# Patient Record
Sex: Female | Born: 1945 | Race: Black or African American | Hispanic: No | Smoking: Never smoker
Health system: Southern US, Community
[De-identification: ages and names within clinical notes are randomized; demographics above are authoritative.]

## PROBLEM LIST (undated history)

## (undated) DIAGNOSIS — E119 Type 2 diabetes mellitus without complications: Secondary | ICD-10-CM

## (undated) DIAGNOSIS — I10 Essential (primary) hypertension: Secondary | ICD-10-CM

## (undated) DIAGNOSIS — J189 Pneumonia, unspecified organism: Secondary | ICD-10-CM

---

## 2001-03-19 ENCOUNTER — Emergency Department (HOSPITAL_COMMUNITY): Admission: EM | Admit: 2001-03-19 | Discharge: 2001-03-19 | Payer: Self-pay | Admitting: Emergency Medicine

## 2014-11-03 DIAGNOSIS — R05 Cough: Secondary | ICD-10-CM | POA: Diagnosis not present

## 2014-11-03 DIAGNOSIS — I129 Hypertensive chronic kidney disease with stage 1 through stage 4 chronic kidney disease, or unspecified chronic kidney disease: Secondary | ICD-10-CM | POA: Diagnosis not present

## 2014-11-03 DIAGNOSIS — R079 Chest pain, unspecified: Secondary | ICD-10-CM | POA: Diagnosis not present

## 2014-11-03 DIAGNOSIS — I10 Essential (primary) hypertension: Secondary | ICD-10-CM | POA: Diagnosis not present

## 2014-11-03 DIAGNOSIS — D491 Neoplasm of unspecified behavior of respiratory system: Secondary | ICD-10-CM | POA: Diagnosis not present

## 2014-11-03 DIAGNOSIS — I76 Septic arterial embolism: Secondary | ICD-10-CM | POA: Diagnosis not present

## 2014-11-03 DIAGNOSIS — R918 Other nonspecific abnormal finding of lung field: Secondary | ICD-10-CM | POA: Diagnosis not present

## 2014-11-03 DIAGNOSIS — N390 Urinary tract infection, site not specified: Secondary | ICD-10-CM | POA: Diagnosis not present

## 2014-11-03 DIAGNOSIS — N179 Acute kidney failure, unspecified: Secondary | ICD-10-CM | POA: Diagnosis not present

## 2014-11-03 DIAGNOSIS — N183 Chronic kidney disease, stage 3 (moderate): Secondary | ICD-10-CM | POA: Diagnosis not present

## 2014-11-03 DIAGNOSIS — J189 Pneumonia, unspecified organism: Secondary | ICD-10-CM | POA: Diagnosis not present

## 2014-11-04 DIAGNOSIS — T39315A Adverse effect of propionic acid derivatives, initial encounter: Secondary | ICD-10-CM | POA: Diagnosis not present

## 2014-11-04 DIAGNOSIS — I776 Arteritis, unspecified: Secondary | ICD-10-CM | POA: Diagnosis not present

## 2014-11-04 DIAGNOSIS — Z7982 Long term (current) use of aspirin: Secondary | ICD-10-CM | POA: Diagnosis not present

## 2014-11-04 DIAGNOSIS — E785 Hyperlipidemia, unspecified: Secondary | ICD-10-CM | POA: Diagnosis present

## 2014-11-04 DIAGNOSIS — N209 Urinary calculus, unspecified: Secondary | ICD-10-CM | POA: Diagnosis not present

## 2014-11-04 DIAGNOSIS — Z833 Family history of diabetes mellitus: Secondary | ICD-10-CM | POA: Diagnosis not present

## 2014-11-04 DIAGNOSIS — E1165 Type 2 diabetes mellitus with hyperglycemia: Secondary | ICD-10-CM | POA: Diagnosis not present

## 2014-11-04 DIAGNOSIS — E872 Acidosis: Secondary | ICD-10-CM | POA: Diagnosis not present

## 2014-11-04 DIAGNOSIS — N179 Acute kidney failure, unspecified: Secondary | ICD-10-CM | POA: Diagnosis not present

## 2014-11-04 DIAGNOSIS — R0602 Shortness of breath: Secondary | ICD-10-CM | POA: Diagnosis not present

## 2014-11-04 DIAGNOSIS — E669 Obesity, unspecified: Secondary | ICD-10-CM | POA: Diagnosis not present

## 2014-11-04 DIAGNOSIS — I10 Essential (primary) hypertension: Secondary | ICD-10-CM | POA: Diagnosis not present

## 2014-11-04 DIAGNOSIS — R112 Nausea with vomiting, unspecified: Secondary | ICD-10-CM | POA: Diagnosis present

## 2014-11-04 DIAGNOSIS — E119 Type 2 diabetes mellitus without complications: Secondary | ICD-10-CM | POA: Diagnosis present

## 2014-11-04 DIAGNOSIS — N183 Chronic kidney disease, stage 3 (moderate): Secondary | ICD-10-CM | POA: Diagnosis present

## 2014-11-04 DIAGNOSIS — I76 Septic arterial embolism: Secondary | ICD-10-CM | POA: Diagnosis not present

## 2014-11-04 DIAGNOSIS — I129 Hypertensive chronic kidney disease with stage 1 through stage 4 chronic kidney disease, or unspecified chronic kidney disease: Secondary | ICD-10-CM | POA: Diagnosis present

## 2014-11-04 DIAGNOSIS — Z794 Long term (current) use of insulin: Secondary | ICD-10-CM | POA: Diagnosis not present

## 2014-11-04 DIAGNOSIS — Z79899 Other long term (current) drug therapy: Secondary | ICD-10-CM | POA: Diagnosis not present

## 2014-11-04 DIAGNOSIS — Z6841 Body Mass Index (BMI) 40.0 and over, adult: Secondary | ICD-10-CM | POA: Diagnosis not present

## 2014-11-04 DIAGNOSIS — J189 Pneumonia, unspecified organism: Secondary | ICD-10-CM | POA: Diagnosis present

## 2014-11-04 DIAGNOSIS — T502X5A Adverse effect of carbonic-anhydrase inhibitors, benzothiadiazides and other diuretics, initial encounter: Secondary | ICD-10-CM | POA: Diagnosis not present

## 2014-11-04 DIAGNOSIS — N200 Calculus of kidney: Secondary | ICD-10-CM | POA: Diagnosis not present

## 2014-11-04 DIAGNOSIS — N170 Acute kidney failure with tubular necrosis: Secondary | ICD-10-CM | POA: Diagnosis not present

## 2014-11-04 DIAGNOSIS — E039 Hypothyroidism, unspecified: Secondary | ICD-10-CM | POA: Diagnosis present

## 2014-11-04 DIAGNOSIS — N390 Urinary tract infection, site not specified: Secondary | ICD-10-CM | POA: Diagnosis not present

## 2014-11-04 DIAGNOSIS — R918 Other nonspecific abnormal finding of lung field: Secondary | ICD-10-CM | POA: Diagnosis present

## 2014-11-04 DIAGNOSIS — J90 Pleural effusion, not elsewhere classified: Secondary | ICD-10-CM | POA: Diagnosis not present

## 2014-11-04 DIAGNOSIS — D491 Neoplasm of unspecified behavior of respiratory system: Secondary | ICD-10-CM | POA: Diagnosis not present

## 2014-11-04 DIAGNOSIS — R079 Chest pain, unspecified: Secondary | ICD-10-CM | POA: Diagnosis not present

## 2014-11-04 DIAGNOSIS — E869 Volume depletion, unspecified: Secondary | ICD-10-CM | POA: Diagnosis not present

## 2014-11-04 DIAGNOSIS — E1122 Type 2 diabetes mellitus with diabetic chronic kidney disease: Secondary | ICD-10-CM | POA: Diagnosis not present

## 2014-11-04 DIAGNOSIS — Z7722 Contact with and (suspected) exposure to environmental tobacco smoke (acute) (chronic): Secondary | ICD-10-CM | POA: Diagnosis present

## 2014-11-08 ENCOUNTER — Inpatient Hospital Stay (HOSPITAL_COMMUNITY): Payer: Medicare Other

## 2014-11-08 ENCOUNTER — Inpatient Hospital Stay (HOSPITAL_COMMUNITY)
Admission: AD | Admit: 2014-11-08 | Discharge: 2014-11-13 | DRG: 682 | Disposition: A | Discharge status: Home / Self Care | Payer: Medicare Other | Source: Other Acute Inpatient Hospital | Attending: Internal Medicine | Admitting: Internal Medicine

## 2014-11-08 ENCOUNTER — Encounter (HOSPITAL_COMMUNITY): Payer: Self-pay | Admitting: Acute Care

## 2014-11-08 DIAGNOSIS — T39315A Adverse effect of propionic acid derivatives, initial encounter: Secondary | ICD-10-CM | POA: Diagnosis present

## 2014-11-08 DIAGNOSIS — Z79899 Other long term (current) drug therapy: Secondary | ICD-10-CM

## 2014-11-08 DIAGNOSIS — E1122 Type 2 diabetes mellitus with diabetic chronic kidney disease: Secondary | ICD-10-CM | POA: Diagnosis present

## 2014-11-08 DIAGNOSIS — E872 Acidosis: Secondary | ICD-10-CM | POA: Diagnosis present

## 2014-11-08 DIAGNOSIS — I129 Hypertensive chronic kidney disease with stage 1 through stage 4 chronic kidney disease, or unspecified chronic kidney disease: Secondary | ICD-10-CM | POA: Diagnosis present

## 2014-11-08 DIAGNOSIS — E869 Volume depletion, unspecified: Secondary | ICD-10-CM | POA: Diagnosis present

## 2014-11-08 DIAGNOSIS — E669 Obesity, unspecified: Secondary | ICD-10-CM | POA: Diagnosis present

## 2014-11-08 DIAGNOSIS — IMO0002 Reserved for concepts with insufficient information to code with codable children: Secondary | ICD-10-CM | POA: Diagnosis present

## 2014-11-08 DIAGNOSIS — Z794 Long term (current) use of insulin: Secondary | ICD-10-CM

## 2014-11-08 DIAGNOSIS — M310 Hypersensitivity angiitis: Secondary | ICD-10-CM

## 2014-11-08 DIAGNOSIS — E1165 Type 2 diabetes mellitus with hyperglycemia: Secondary | ICD-10-CM | POA: Diagnosis present

## 2014-11-08 DIAGNOSIS — J189 Pneumonia, unspecified organism: Secondary | ICD-10-CM | POA: Diagnosis present

## 2014-11-08 DIAGNOSIS — N209 Urinary calculus, unspecified: Secondary | ICD-10-CM | POA: Diagnosis present

## 2014-11-08 DIAGNOSIS — I10 Essential (primary) hypertension: Secondary | ICD-10-CM | POA: Diagnosis present

## 2014-11-08 DIAGNOSIS — Z833 Family history of diabetes mellitus: Secondary | ICD-10-CM | POA: Diagnosis not present

## 2014-11-08 DIAGNOSIS — E1129 Type 2 diabetes mellitus with other diabetic kidney complication: Secondary | ICD-10-CM | POA: Diagnosis not present

## 2014-11-08 DIAGNOSIS — I120 Hypertensive chronic kidney disease with stage 5 chronic kidney disease or end stage renal disease: Secondary | ICD-10-CM | POA: Diagnosis not present

## 2014-11-08 DIAGNOSIS — Z6841 Body Mass Index (BMI) 40.0 and over, adult: Secondary | ICD-10-CM | POA: Diagnosis not present

## 2014-11-08 DIAGNOSIS — J984 Other disorders of lung: Secondary | ICD-10-CM | POA: Diagnosis not present

## 2014-11-08 DIAGNOSIS — N179 Acute kidney failure, unspecified: Secondary | ICD-10-CM | POA: Diagnosis present

## 2014-11-08 DIAGNOSIS — N189 Chronic kidney disease, unspecified: Secondary | ICD-10-CM | POA: Diagnosis not present

## 2014-11-08 DIAGNOSIS — T502X5A Adverse effect of carbonic-anhydrase inhibitors, benzothiadiazides and other diuretics, initial encounter: Secondary | ICD-10-CM | POA: Diagnosis present

## 2014-11-08 DIAGNOSIS — Z7982 Long term (current) use of aspirin: Secondary | ICD-10-CM

## 2014-11-08 DIAGNOSIS — I776 Arteritis, unspecified: Secondary | ICD-10-CM | POA: Diagnosis present

## 2014-11-08 DIAGNOSIS — N19 Unspecified kidney failure: Secondary | ICD-10-CM | POA: Diagnosis not present

## 2014-11-08 DIAGNOSIS — E039 Hypothyroidism, unspecified: Secondary | ICD-10-CM | POA: Diagnosis present

## 2014-11-08 DIAGNOSIS — N183 Chronic kidney disease, stage 3 unspecified: Secondary | ICD-10-CM | POA: Diagnosis present

## 2014-11-08 DIAGNOSIS — R918 Other nonspecific abnormal finding of lung field: Secondary | ICD-10-CM

## 2014-11-08 DIAGNOSIS — N170 Acute kidney failure with tubular necrosis: Principal | ICD-10-CM | POA: Diagnosis present

## 2014-11-08 DIAGNOSIS — R0602 Shortness of breath: Secondary | ICD-10-CM | POA: Diagnosis not present

## 2014-11-08 DIAGNOSIS — E785 Hyperlipidemia, unspecified: Secondary | ICD-10-CM | POA: Diagnosis present

## 2014-11-08 HISTORY — DX: Type 2 diabetes mellitus without complications: E11.9

## 2014-11-08 HISTORY — DX: Pneumonia, unspecified organism: J18.9

## 2014-11-08 HISTORY — DX: Essential (primary) hypertension: I10

## 2014-11-08 LAB — COMPREHENSIVE METABOLIC PANEL
ALT: 18 U/L (ref 14–54)
AST: 21 U/L (ref 15–41)
Albumin: 1.9 g/dL — ABNORMAL LOW (ref 3.5–5.0)
Alkaline Phosphatase: 36 U/L — ABNORMAL LOW (ref 38–126)
Anion gap: 10 (ref 5–15)
BILIRUBIN TOTAL: 0.5 mg/dL (ref 0.3–1.2)
BUN: 66 mg/dL — AB (ref 6–20)
CHLORIDE: 105 mmol/L (ref 101–111)
CO2: 18 mmol/L — ABNORMAL LOW (ref 22–32)
Calcium: 7.2 mg/dL — ABNORMAL LOW (ref 8.9–10.3)
Creatinine, Ser: 6.91 mg/dL — ABNORMAL HIGH (ref 0.44–1.00)
GFR calc Af Amer: 6 mL/min — ABNORMAL LOW (ref >60)
GFR calc non Af Amer: 6 mL/min — ABNORMAL LOW (ref >60)
Glucose, Bld: 126 mg/dL — ABNORMAL HIGH (ref 65–99)
Potassium: 5 mmol/L (ref 3.5–5.1)
Sodium: 133 mmol/L — ABNORMAL LOW (ref 135–145)
TOTAL PROTEIN: 8.1 g/dL (ref 6.5–8.1)

## 2014-11-08 LAB — CK: Total CK: 185 U/L (ref 38–234)

## 2014-11-08 LAB — URINE MICROSCOPIC-ADD ON

## 2014-11-08 LAB — URINALYSIS, ROUTINE W REFLEX MICROSCOPIC
Bilirubin Urine: NEGATIVE
GLUCOSE, UA: NEGATIVE mg/dL
KETONES UR: NEGATIVE mg/dL
Nitrite: NEGATIVE
PROTEIN: 100 mg/dL — AB
Specific Gravity, Urine: 1.014 (ref 1.005–1.030)
Urobilinogen, UA: 0.2 mg/dL (ref 0.0–1.0)
pH: 5 (ref 5.0–8.0)

## 2014-11-08 LAB — CBC WITH DIFFERENTIAL/PLATELET
Basophils Absolute: 0 10*3/uL (ref 0.0–0.1)
Basophils Relative: 1 % (ref 0–1)
Eosinophils Absolute: 0.1 10*3/uL (ref 0.0–0.7)
Eosinophils Relative: 2 % (ref 0–5)
HEMATOCRIT: 27.7 % — AB (ref 36.0–46.0)
Hemoglobin: 8.7 g/dL — ABNORMAL LOW (ref 12.0–15.0)
LYMPHS ABS: 2 10*3/uL (ref 0.7–4.0)
LYMPHS PCT: 28 % (ref 12–46)
MCH: 27.2 pg (ref 26.0–34.0)
MCHC: 31.4 g/dL (ref 30.0–36.0)
MCV: 86.6 fL (ref 78.0–100.0)
MONOS PCT: 10 % (ref 3–12)
Monocytes Absolute: 0.7 10*3/uL (ref 0.1–1.0)
Neutro Abs: 4.2 10*3/uL (ref 1.7–7.7)
Neutrophils Relative %: 59 % (ref 43–77)
PLATELETS: 281 10*3/uL (ref 150–400)
RBC: 3.2 MIL/uL — ABNORMAL LOW (ref 3.87–5.11)
RDW: 15.5 % (ref 11.5–15.5)
WBC: 7 10*3/uL (ref 4.0–10.5)

## 2014-11-08 LAB — GLUCOSE, CAPILLARY: Glucose-Capillary: 116 mg/dL — ABNORMAL HIGH (ref 65–99)

## 2014-11-08 LAB — MRSA PCR SCREENING: MRSA by PCR: NEGATIVE

## 2014-11-08 LAB — TSH: TSH: 4.208 u[IU]/mL (ref 0.350–4.500)

## 2014-11-08 LAB — CREATININE, URINE, RANDOM: CREATININE, URINE: 108.83 mg/dL

## 2014-11-08 LAB — PHOSPHORUS: Phosphorus: 5.8 mg/dL — ABNORMAL HIGH (ref 2.5–4.6)

## 2014-11-08 LAB — MAGNESIUM: MAGNESIUM: 2.2 mg/dL (ref 1.7–2.4)

## 2014-11-08 LAB — SODIUM, URINE, RANDOM: SODIUM UR: 35 mmol/L

## 2014-11-08 LAB — LACTIC ACID, PLASMA: LACTIC ACID, VENOUS: 0.5 mmol/L (ref 0.5–2.0)

## 2014-11-08 MED ORDER — INSULIN ASPART 100 UNIT/ML ~~LOC~~ SOLN
0.0000 [IU] | Freq: Three times a day (TID) | SUBCUTANEOUS | Status: DC
  Administered 2014-11-09 (×3): 3 [IU] via SUBCUTANEOUS
  Administered 2014-11-10: 8 [IU] via SUBCUTANEOUS
  Administered 2014-11-10: 5 [IU] via SUBCUTANEOUS
  Administered 2014-11-10: 3 [IU] via SUBCUTANEOUS
  Administered 2014-11-11 – 2014-11-12 (×5): 5 [IU] via SUBCUTANEOUS
  Administered 2014-11-12: 1 [IU] via SUBCUTANEOUS
  Administered 2014-11-13: 3 [IU] via SUBCUTANEOUS
  Administered 2014-11-13: 2 [IU] via SUBCUTANEOUS

## 2014-11-08 MED ORDER — ONDANSETRON HCL 4 MG/2ML IJ SOLN
4.0000 mg | Freq: Four times a day (QID) | INTRAMUSCULAR | Status: DC | PRN
  Administered 2014-11-11 – 2014-11-12 (×2): 4 mg via INTRAVENOUS
  Filled 2014-11-08 (×2): qty 2

## 2014-11-08 MED ORDER — IOHEXOL 300 MG/ML  SOLN
25.0000 mL | INTRAMUSCULAR | Status: AC
  Administered 2014-11-08 (×2): 25 mL via ORAL

## 2014-11-08 MED ORDER — ACETAMINOPHEN 325 MG PO TABS
650.0000 mg | ORAL_TABLET | Freq: Four times a day (QID) | ORAL | Status: DC | PRN
  Administered 2014-11-10: 650 mg via ORAL
  Filled 2014-11-08 (×2): qty 2

## 2014-11-08 MED ORDER — ONDANSETRON HCL 4 MG PO TABS: 4.0000 mg | ORAL_TABLET | Freq: Four times a day (QID) | ORAL | Status: DC | PRN

## 2014-11-08 MED ORDER — HYDRALAZINE HCL 20 MG/ML IJ SOLN: 10.0000 mg | Freq: Four times a day (QID) | INTRAMUSCULAR | Status: DC | PRN

## 2014-11-08 MED ORDER — ACETAMINOPHEN 650 MG RE SUPP: 650.0000 mg | Freq: Four times a day (QID) | RECTAL | Status: DC | PRN

## 2014-11-08 MED ORDER — CARVEDILOL 12.5 MG PO TABS
12.5000 mg | ORAL_TABLET | Freq: Two times a day (BID) | ORAL | Status: DC
  Administered 2014-11-08 – 2014-11-09 (×2): 12.5 mg via ORAL
  Filled 2014-11-08 (×4): qty 1

## 2014-11-08 MED ORDER — INSULIN ASPART 100 UNIT/ML ~~LOC~~ SOLN: 0.0000 [IU] | Freq: Every day | SUBCUTANEOUS | Status: DC

## 2014-11-08 MED ORDER — LEVOTHYROXINE SODIUM 100 MCG PO TABS
100.0000 ug | ORAL_TABLET | Freq: Every day | ORAL | Status: DC
  Administered 2014-11-09 – 2014-11-13 (×5): 100 ug via ORAL
  Filled 2014-11-08 (×7): qty 1

## 2014-11-08 MED ORDER — SODIUM CHLORIDE 0.9 % IJ SOLN
3.0000 mL | Freq: Two times a day (BID) | INTRAMUSCULAR | Status: DC
  Administered 2014-11-08 – 2014-11-09 (×2): 3 mL via INTRAVENOUS

## 2014-11-08 MED ORDER — ENOXAPARIN SODIUM 30 MG/0.3ML ~~LOC~~ SOLN
30.0000 mg | SUBCUTANEOUS | Status: DC
  Administered 2014-11-08: 30 mg via SUBCUTANEOUS
  Filled 2014-11-08 (×2): qty 0.3

## 2014-11-08 NOTE — H&P (Signed)
History and Physical  Sheri Pearson RAQ:[id number] DOB: 1945/08/27 DOA: 11/08/2014   PCP: No primary care provider on file.  Referring Physician: [facility] Transfer  Chief Complaint: SOB  HPI:  69 year old female with a history of diabetes mellitus type 2, hyperlipidemia, CKD stage III presented today and/or regional Hospital on 11/03/2014 with 1 month history of shortness of breath and coughing that had progressively worsened over a period of 2 weeks. The patient states that she had had 3-4 days of intermittent nausea and vomiting, but also endorsed poor compliance with her insulin and other medications at home.  Unfortunately, during a transfer from [facility], the patient's radiographic studies did not make it with the transfer, and not all of her labs were sent. 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 The patient was given intravenous hydralazine and labetalol. There was also concern at that time that the patient may have had flash pulmonary edema, and the patient was started on intravenous furosemide. In addition, the patient had been complaining of intermittent chest discomfort mostly at rest. She was seen by cardiology who felt that her chest discomfort was atypical. Nevertheless, a Lexiscan was performed and apparently was negative. Apparently, echocardiogram was performed and showed an EF of 60%, but I do not have the detailed report. It is unclear exactly how much intravenous furosemide the patient received for her pulmonary edema. Nevertheless, the patient stated that her shortness of breath did not improve significantly. A CT of the chest without contrast was obtained which reportedly showed bilateral irregular round opacities. Per pulmonology was consulted and they felt the working diagnosis was multifocal pneumonia versus BOOP vs malignancy. The patient was started  empirically on vancomycin, Zosyn, and levofloxacin on 11/06/2014. However, the patient never had any leukocytosis during her hospitalization, nor does she have any fevers. There was consideration for bronchoscopy, but the patient was a little hesitant and wanted another opinion. In addition, the patient began developing worsening renal function. Nephrology was consulted and felt initially that the patient's renal failure was multifactorial due to hematoma dynamic changes from her hypertensive urgency, both bulimia from her vomiting prior to admission, furosemide, and a component of ATN. There was consideration for possible vasculitis when she was noted to have these pulmonary opacities. The patient's renal function gradually plateaued  at 6.31 on the day of transfer to [facility]. CPK was 153, urinalysis showed 0-2 WBCs, 300 protein. Her admission serum creatinine was 2.20, but it was noted that her baseline creatinine was around 1.4 around November 2012. Because of the patient's worsening renal failure and unclear etiology of her pulmonary opacities, she was transferred to [facility], for further workup.   Presently, the patient denies any chest discomfort, shortness breath, nausea, vomiting, diarrhea, abdominal pain, dysuria, hematuria, rashes, hematochezia, melena. She denies any headache, visual disturbance, focal extremity weakness. Upon arrival, The patient was afebrile and hemodynamically stable without tachycardia. Oxygen saturation was 94-96 percent on room air. Assessment/Plan: Acute on chronic renal failure (CKD 3) -Multifactorial including hemodynamic changes, volume depletion, with possibility of vasculitis in the setting of ARB and NSAID (daily ibuprofen) use prior to hospitalization -Renal ultrasound -Urinalysis -Check urine sodium, urine creatinine -Bicarbonate was 18, with potassium 5.1 at the time of transfer. 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 -Will not restart antibiotics at this time and monitor the patient clinically as she is afebrile and hemodynamically stable without leukocytosis  Diabetes mellitus type 2  -Hemoglobin A1c  -NovoLog sliding scale  -Hold Levemir for now and monitor CBGs  -At baseline, the patient is Levemir 45 units at bedtime  Hypertension  -Restart carvedilol at lower dose  -Hold amlodipine -Hydralazine when necessary SBP >180 Hypothyroidism  -Continue Synthroid 100 g daily  -TSH Hyperlipidemia  -Hold Crestor for now  Abdominal pain -CT abdomen and pelvis with oral contrast only, no IV contrast     Past Medical History  Diagnosis Date  . Diabetes mellitus without complication   . Hypertension   . Pneumonia    History reviewed. No pertinent past surgical history. Social History:  reports that she has never smoked. She does not have any smokeless tobacco history on file. Her alcohol and drug histories are not on file.   Family history reviewed.  Significant for DM in parents   Allergies not on file    Prior to Admission medications   Medication Sig Start Date End Date Taking? Authorizing Provider  carvedilol (COREG) 12.5 MG tablet Take 12.5 mg by mouth 2 (two) times daily. 10/22/14   Historical Provider, MD  MICARDIS 80 MG tablet Take 80 mg by mouth daily. for blood pressure 09/08/14   Historical Provider, MD    Review of Systems:  Constitutional:  No weight loss, night sweats,  Head&Eyes: No headache.  No vision loss.  No eye pain or scotoma ENT:  No Difficulty swallowing,Tooth/dental problems,Sore throat,  No ear ache, post nasal drip,  Cardio-vascular:  No chest pain, Orthopnea, PND, swelling in lower extremities,  dizziness, palpitations    GI:  No  abdominal pain, nausea, vomiting, diarrhea, loss of appetite, hematochezia, melena, heartburn, indigestion, Resp:  No shortness of breath with exertion or at rest. No cough. No coughing up of blood .No wheezing.No chest wall deformity  Skin:  no rash or lesions.  GU:  no dysuria, change in color of urine, no urgency or frequency. No flank pain.  Musculoskeletal:  No joint pain or swelling. No decreased range of motion. No back pain.  Psych:  No change in mood or affect. No depression or anxiety. Neurologic: No headache, no dysesthesia, no focal weakness, no vision loss. No syncope  Physical Exam: Filed Vitals:   11/08/14 1717  BP: 113/63  Pulse: 83  Temp: 99.9 F (37.7 C)  TempSrc: Oral  Resp: 12  Height: 5\' 6"  (1.676 m)  Weight: 127.3 kg (280 lb 10.3 oz)  SpO2: 94%   General:  A&O x 3, NAD, nontoxic, pleasant/cooperative Head/Eye: No conjunctival hemorrhage, no icterus, [place]/AT, No nystagmus ENT:  No icterus,  No thrush, good dentition, no pharyngeal exudate Neck:  No masses, no lymphadenpathy, no bruits CV:  RRR, no rub, no gallop, no S3 Lung:  CTAB, good air movement, no wheeze, no rhonchi Abdomen: soft/NT, +BS, nondistended, no peritoneal signs Ext: No cyanosis, No rashes, No petechiae, No lymphangitis, No edema Neuro: CNII-XII intact, strength 4/5 in bilateral upper and lower extremities, no dysmetria  Labs on Admission:  Basic Metabolic Panel: No results for input(s): NA, K, CL, CO2, GLUCOSE, BUN, CREATININE, CALCIUM, MG, PHOS in the last 168 hours. Liver Function Tests: No results for input(s): AST, ALT, ALKPHOS, BILITOT, PROT, ALBUMIN in the  last 168 hours. No results for input(s): LIPASE, AMYLASE in the last 168 hours. No results for input(s): AMMONIA in the last 168 hours. CBC: No results for input(s): WBC, NEUTROABS, HGB, HCT, MCV, PLT in the last 168 hours. Cardiac Enzymes: No results for input(s): CKTOTAL, CKMB, CKMBINDEX, TROPONINI in the last  168 hours. BNP: Invalid input(s): POCBNP CBG: No results for input(s): GLUCAP in the last 168 hours.  Radiological Exams on Admission: No results found.  EKG: Independently reviewed. pending    Time spent:70 minutes Code Status:   FULL Family Communication:   Son updated at bedside   Sheri Cilia, DO  [facility] Pager [phone]  If 7PM-7AM, please contact night-coverage [email or web address] Password [facility] 11/08/2014, 6:29 PM

## 2014-11-09 ENCOUNTER — Inpatient Hospital Stay (HOSPITAL_COMMUNITY): Payer: Medicare Other

## 2014-11-09 DIAGNOSIS — E669 Obesity, unspecified: Secondary | ICD-10-CM

## 2014-11-09 LAB — GLUCOSE, CAPILLARY
GLUCOSE-CAPILLARY: 179 mg/dL — AB (ref 65–99)
GLUCOSE-CAPILLARY: 158 mg/dL — AB (ref 65–99)
Glucose-Capillary: 167 mg/dL — ABNORMAL HIGH (ref 65–99)
Glucose-Capillary: 143 mg/dL — ABNORMAL HIGH (ref 65–99)

## 2014-11-09 LAB — HIV ANTIBODY (ROUTINE TESTING W REFLEX): HIV Screen 4th Generation wRfx: NONREACTIVE

## 2014-11-09 LAB — BASIC METABOLIC PANEL
Anion gap: 9 (ref 5–15)
BUN: 70 mg/dL — AB (ref 6–20)
CALCIUM: 7.2 mg/dL — AB (ref 8.9–10.3)
CO2: 17 mmol/L — AB (ref 22–32)
Chloride: 106 mmol/L (ref 101–111)
Creatinine, Ser: 7.29 mg/dL — ABNORMAL HIGH (ref 0.44–1.00)
GFR, EST AFRICAN AMERICAN: 6 mL/min — AB (ref >60)
GFR, EST NON AFRICAN AMERICAN: 5 mL/min — AB (ref >60)
Glucose, Bld: 166 mg/dL — ABNORMAL HIGH (ref 65–99)
Potassium: 5.3 mmol/L — ABNORMAL HIGH (ref 3.5–5.1)
Sodium: 132 mmol/L — ABNORMAL LOW (ref 135–145)

## 2014-11-09 LAB — CBC
HCT: 27.5 % — ABNORMAL LOW (ref 36.0–46.0)
Hemoglobin: 8.6 g/dL — ABNORMAL LOW (ref 12.0–15.0)
MCH: 26.7 pg (ref 26.0–34.0)
MCHC: 31.3 g/dL (ref 30.0–36.0)
MCV: 85.4 fL (ref 78.0–100.0)
PLATELETS: 303 10*3/uL (ref 150–400)
RBC: 3.22 MIL/uL — ABNORMAL LOW (ref 3.87–5.11)
RDW: 15.6 % — AB (ref 11.5–15.5)
WBC: 7.4 10*3/uL (ref 4.0–10.5)

## 2014-11-09 LAB — LACTIC ACID, PLASMA: Lactic Acid, Venous: 0.7 mmol/L (ref 0.5–2.0)

## 2014-11-09 LAB — VANCOMYCIN, RANDOM: Vancomycin Rm: 21 ug/mL

## 2014-11-09 MED ORDER — SODIUM BICARBONATE 8.4 % IV SOLN
INTRAVENOUS | Status: DC
Start: 2014-11-09 — End: 2014-11-10
  Administered 2014-11-09: 22:00:00 via INTRAVENOUS
  Filled 2014-11-09 (×3): qty 150

## 2014-11-09 MED ORDER — HEPARIN SODIUM (PORCINE) 5000 UNIT/ML IJ SOLN
5000.0000 [IU] | Freq: Three times a day (TID) | INTRAMUSCULAR | Status: DC
  Administered 2014-11-09 – 2014-11-13 (×11): 5000 [IU] via SUBCUTANEOUS
  Filled 2014-11-09 (×14): qty 1

## 2014-11-09 MED ORDER — PIPERACILLIN-TAZOBACTAM IN DEX 2-0.25 GM/50ML IV SOLN
2.2500 g | Freq: Three times a day (TID) | INTRAVENOUS | Status: DC
  Administered 2014-11-09 – 2014-11-12 (×8): 2.25 g via INTRAVENOUS
  Filled 2014-11-09 (×11): qty 50

## 2014-11-09 MED ORDER — SODIUM CHLORIDE 0.9 % IV BOLUS (SEPSIS)
500.0000 mL | Freq: Once | INTRAVENOUS | Status: AC
  Administered 2014-11-09: 500 mL via INTRAVENOUS

## 2014-11-09 MED ORDER — VANCOMYCIN HCL 10 G IV SOLR
1500.0000 mg | Freq: Once | INTRAVENOUS | Status: AC
  Administered 2014-11-10: 1500 mg via INTRAVENOUS
  Filled 2014-11-09: qty 1500

## 2014-11-09 MED ORDER — CARVEDILOL 6.25 MG PO TABS
6.2500 mg | ORAL_TABLET | Freq: Two times a day (BID) | ORAL | Status: DC
  Administered 2014-11-10 – 2014-11-13 (×7): 6.25 mg via ORAL
  Filled 2014-11-09 (×9): qty 1

## 2014-11-09 NOTE — Progress Notes (Signed)
   11/09/14 1451  Vitals  Temp (!) 102.5 F (39.2 C)  Tylenol given

## 2014-11-09 NOTE — Progress Notes (Signed)
ANTIBIOTIC CONSULT NOTE - INITIAL  Pharmacy Consult for vancomycin + zosyn Indication: rule out pneumonia  Not on File  Patient Measurements: Height: 5\' 6"  (167.6 cm) Weight: 278 lb 3.5 oz (126.2 kg) IBW/kg (Calculated) : 59.3 Adjusted Body Weight:   Vital Signs: Temp: 100.5 F (38.1 C) (06/26 2043) Temp Source: Oral (06/26 2043) BP: 148/50 mmHg (06/26 2043) Pulse Rate: 89 (06/26 2043) Intake/Output from previous day: 06/25 0701 - 06/26 0700 In: 480 [P.O.:480] Out: 125 [Urine:125] Intake/Output from this shift:    Labs:  Recent Labs  11/08/14 1927 11/08/14 2209 11/09/14 0230  WBC 7.0  --  7.4  HGB 8.7*  --  8.6*  PLT 281  --  303  LABCREA  --  108.83  --   CREATININE 6.91*  --  7.29*   Estimated Creatinine Clearance: 10.3 mL/min (by C-G formula based on Cr of 7.29).  Recent Labs  11/09/14 1730  VANCORANDOM 21     Microbiology: Recent Results (from the past 720 hour(s))  MRSA PCR Screening     Status: None   Collection Time: 11/08/14  2:00 PM  Result Value Ref Range Status   MRSA by PCR NEGATIVE NEGATIVE Final    Comment:        The GeneXpert MRSA Assay (FDA approved for NASAL specimens only), is one component of a comprehensive MRSA colonization surveillance program. It is not intended to diagnose MRSA infection nor to guide or monitor treatment for MRSA infections.     Medical History: Past Medical History  Diagnosis Date  . Diabetes mellitus without complication   . Hypertension   . Pneumonia     Medications:  Anti-infectives    Start     Dose/Rate Route Frequency Ordered Stop   11/09/14 2300  vancomycin (VANCOCIN) 1,500 mg in sodium chloride 0.9 % 500 mL IVPB     1,500 mg 250 mL/hr over 120 Minutes Intravenous  Once 11/09/14 2109     11/09/14 1700  piperacillin-tazobactam (ZOSYN) IVPB 2.25 g     2.25 g 100 mL/hr over 30 Minutes Intravenous Every 8 hours 11/09/14 1640       Assessment: 31 yof transferred from OSH yesterday for  worsening renal function. To restart antibiotics for treatment of pneumonia. Scr has rapidly increased to 7.29. She was started on vancomycin and zosyn at OSH but were not resumed until today. Last dose of vancomycin was 6/23 at 0445. A random level today is slightly above goal at 21.   VR 6/26 = 21  Vanc 6/23>>6/25; 6/26>> Zosyn 6/23 >>6/26; 6/26>>  Goal of Therapy:  Vancomycin trough level 15-20 mcg/ml  Plan:  - Give vancomycin 1500mg  IV x 1 later tonight - d/t rapidly changing renal function will plan to dose further vancomycin based on levels, likely wouldn't need another dose until 6/28 - Zosyn 2.25gm IV Q8H - F/u renal plans/fxn, C&S, clinical status and VT at [place], Rande Lawman 11/09/2014,9:10 PM

## 2014-11-09 NOTE — Progress Notes (Signed)
[place] TEAM 1 - Stepdown/ICU TEAM Progress Note  Sheri Pearson [id number]:[id number] DOB: July 12, 1945 DOA: 11/08/2014 PCP: No primary care provider on file.  Admit HPI / Brief Narrative: 69 year old female with a history of DM 2, hyperlipidemia, and CKD stage III who presented to [facility] on 11/03/2014 with a 1 month history of shortness of breath and coughing that had progressively worsened over a period of 2 weeks. Unfortunately the patient's radiographic studies did not make it during her transfer, and not all of her labs were sent. 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 The patient was given intravenous hydralazine and labetalol. There was concern the patient had flash pulmonary edema, so she was tx w/ IV furosemide. In addition, the patient complained of intermittent chest discomfort.  Cardiology felt her chest sx were atypical. A Lexiscan was negative. A TTE showed an EF of 60%, but no further detail is available. Her shortness of breath did not improve significantly. A CT of the chest showed bilateral irregular round opacities. Pulmonology was consulted and they felt the working diagnosis was multifocal pneumonia versus BOOP vs malignancy. The patient was started on Vancomycin, Zosyn, and Levofloxacin on 11/06/2014. The patient never had a leukocytosis during her hospitalization, nor did she have a fever. There was consideration for bronchoscopy, but the patient wanted another opinion.  In addition, the patient developed worsening renal function. Nephrology felt the renal failure was due to her hypertensive urgency, furosemide, and a component of ATN. The patient's crt plateauedat 6.31 on the day of transfer to [facility]. CPK was 153, urinalysis showed 0-2 WBCs, 300 protein. Her admission serum creatinine was 2.20, but it was noted that her baseline creatinine was around 1.4 in November 2012.  Because of the patient's worsening renal failure and unclear etiology of her pulmonary opacities, she was transferred to [facility].  Upon arrival she was entirely stable.    HPI/Subjective: Patient currently states she feels much better.  She is having frequent loose stools but reportedly was treated with Kayexalate at the outside hospital.  She denies abdominal pain or cramps.  She denies shortness of breath.  She has been febrile today to 102.5.  She is not coughing nor is she expectorating sputum.  Assessment/Plan:  Acute on chronic renal failure stage 3 Likely multifactorial to include malignant HTN, volume depletion, possible vasculitis, ARB and NSAID (daily ibuprofen) use - FeNa 1.67% - stopped lasix and ARB upon transfer - crt 1.31 2013 - gently hydrate with bicarbonate and follow bicarbonate - no acute indication for hemodialysis - if however her potassium continues to climb renal consultation may very well be required - instigate serum workup  Pulmonary opacities - Fever to 102.5 patchy bilateral nodular airspace process noted on CT chest - CT chest here compared to CT chest and [place] suggest no significant change - recurrence of fever as high as 102.5 strongly suggestive of infectious process - resume antibiotics using Zosyn and vancomycin after blood cultures 2 obtained  Diabetes mellitus type 2  CBG currently reasonably controlled - follow  HTN BP currently well controlled  Hypothyroidism Continue home Synthroid dose  HLD  Abdom pain  CT abdom w/o acute findings - sx resolved   Obesity - Body mass index is 44.93 kg/(m^2).   Code Status: FULL Family Communication: Spoke with son at bedside at length Disposition Plan: SDU  Consultants: None  Procedures: none  Antibiotics: Zosyn 6/26 > Vancomycin  6/26 >  DVT prophylaxis: Subcutaneous heparin  Objective: Blood pressure 119/41, pulse 88, temperature 100.3 F (37.9 C), temperature source Oral, resp. rate 25,  height 5\' 6"  (1.676 m), weight 126.2 kg (278 lb 3.5 oz), SpO2 95 %.  Intake/Output Summary (Last 24 hours) at 11/09/14 0810 Last data filed at 11/08/14 2209  Gross per 24 hour  Intake    480 ml  Output    125 ml  Net    355 ml   Exam: General: No acute respiratory distress - alert and conversant Lungs: Clear to auscultation bilaterally without wheezes or crackles Cardiovascular: Regular rate and rhythm without murmur gallop or rub normal S1 and S2 Abdomen: Nontender, obese, soft, bowel sounds positive, no rebound, no ascites, no appreciable mass Extremities: No significant cyanosis, clubbing, or edema bilateral lower extremities  Data Reviewed: Basic Metabolic Panel:  Recent Labs Lab 11/08/14 1927 11/09/14 0230  NA 133* 132*  K 5.0 5.3*  CL 105 106  CO2 18* 17*  GLUCOSE 126* 166*  BUN 66* 70*  CREATININE 6.91* 7.29*  CALCIUM 7.2* 7.2*  MG 2.2  --   PHOS 5.8*  --     CBC:  Recent Labs Lab 11/08/14 1927 11/09/14 0230  WBC 7.0 7.4  NEUTROABS 4.2  --   HGB 8.7* 8.6*  HCT 27.7* 27.5*  MCV 86.6 85.4  PLT 281 303    Liver Function Tests:  Recent Labs Lab 11/08/14 1927  AST 21  ALT 18  ALKPHOS 36*  BILITOT 0.5  PROT 8.1  ALBUMIN 1.9*   No results for input(s): LIPASE, AMYLASE in the last 168 hours. No results for input(s): AMMONIA in the last 168 hours.  Coags: No results for input(s): INR in the last 168 hours.  Invalid input(s): PT No results for input(s): APTT in the last 168 hours.  Cardiac Enzymes:  Recent Labs Lab 11/08/14 1927  CKTOTAL 185    CBG:  Recent Labs Lab 11/08/14 2152 11/09/14 0801  GLUCAP 116* 167*    Recent Results (from the past 240 hour(s))  MRSA PCR Screening     Status: None   Collection Time: 11/08/14  2:00 PM  Result Value Ref Range Status   MRSA by PCR NEGATIVE NEGATIVE Final    Comment:        The GeneXpert MRSA Assay (FDA approved for NASAL specimens only), is one component of a comprehensive MRSA  colonization surveillance program. It is not intended to diagnose MRSA infection nor to guide or monitor treatment for MRSA infections.      Studies:   Recent x-ray studies have been reviewed in detail by the Attending Physician  Scheduled Meds:  Scheduled Meds: . carvedilol  12.5 mg Oral BID WC  . enoxaparin (LOVENOX) injection  30 mg Subcutaneous Q24H  . insulin aspart  0-15 Units Subcutaneous TID WC  . insulin aspart  0-5 Units Subcutaneous QHS  . levothyroxine  100 mcg Oral QAC breakfast  . sodium chloride  3 mL Intravenous Q12H    Time spent on care of this patient: 35 mins   MCCLUNG,JEFFREY T , MD   [facility] Office  [phone] Pager - Text Page per Shea Evans as per below:  On-Call/Text Page:      [email or web address]      password [email or web address]  If 7PM-7AM, please contact night-coverage [email or web address] Password [email or web address] 11/09/2014, 8:10 AM   LOS: 1 day

## 2014-11-10 ENCOUNTER — Inpatient Hospital Stay (HOSPITAL_COMMUNITY): Payer: Medicare Other

## 2014-11-10 LAB — COMPREHENSIVE METABOLIC PANEL
ALT: 22 U/L (ref 14–54)
ANION GAP: 6 (ref 5–15)
AST: 25 U/L (ref 15–41)
Albumin: 1.8 g/dL — ABNORMAL LOW (ref 3.5–5.0)
Alkaline Phosphatase: 31 U/L — ABNORMAL LOW (ref 38–126)
BUN: 77 mg/dL — ABNORMAL HIGH (ref 6–20)
CALCIUM: 7.3 mg/dL — AB (ref 8.9–10.3)
CHLORIDE: 115 mmol/L — AB (ref 101–111)
CO2: 17 mmol/L — ABNORMAL LOW (ref 22–32)
Creatinine, Ser: 7.61 mg/dL — ABNORMAL HIGH (ref 0.44–1.00)
GFR calc non Af Amer: 5 mL/min — ABNORMAL LOW (ref >60)
GFR, EST AFRICAN AMERICAN: 6 mL/min — AB (ref >60)
Glucose, Bld: 172 mg/dL — ABNORMAL HIGH (ref 65–99)
Potassium: 4.8 mmol/L (ref 3.5–5.1)
Sodium: 138 mmol/L (ref 135–145)
TOTAL PROTEIN: 8 g/dL (ref 6.5–8.1)
Total Bilirubin: 0.4 mg/dL (ref 0.3–1.2)

## 2014-11-10 LAB — URINE MICROSCOPIC-ADD ON

## 2014-11-10 LAB — CBC
HEMATOCRIT: 27 % — AB (ref 36.0–46.0)
Hemoglobin: 8.4 g/dL — ABNORMAL LOW (ref 12.0–15.0)
MCH: 26.8 pg (ref 26.0–34.0)
MCHC: 31.1 g/dL (ref 30.0–36.0)
MCV: 86.3 fL (ref 78.0–100.0)
PLATELETS: 313 10*3/uL (ref 150–400)
RBC: 3.13 MIL/uL — ABNORMAL LOW (ref 3.87–5.11)
RDW: 15.7 % — ABNORMAL HIGH (ref 11.5–15.5)
WBC: 7.7 10*3/uL (ref 4.0–10.5)

## 2014-11-10 LAB — URINALYSIS, ROUTINE W REFLEX MICROSCOPIC
Bilirubin Urine: NEGATIVE
Glucose, UA: NEGATIVE mg/dL
Ketones, ur: NEGATIVE mg/dL
Leukocytes, UA: NEGATIVE
NITRITE: NEGATIVE
Protein, ur: 100 mg/dL — AB
SPECIFIC GRAVITY, URINE: 1.012 (ref 1.005–1.030)
Urobilinogen, UA: 0.2 mg/dL (ref 0.0–1.0)
pH: 5 (ref 5.0–8.0)

## 2014-11-10 LAB — GLUCOSE, CAPILLARY
Glucose-Capillary: 181 mg/dL — ABNORMAL HIGH (ref 65–99)
Glucose-Capillary: 273 mg/dL — ABNORMAL HIGH (ref 65–99)
Glucose-Capillary: 215 mg/dL — ABNORMAL HIGH (ref 65–99)
Glucose-Capillary: 181 mg/dL — ABNORMAL HIGH (ref 65–99)

## 2014-11-10 LAB — CLOSTRIDIUM DIFFICILE BY PCR: Toxigenic C. Difficile by PCR: NEGATIVE

## 2014-11-10 LAB — ANTINUCLEAR ANTIBODIES, IFA: ANA Ab, IFA: NEGATIVE

## 2014-11-10 LAB — SEDIMENTATION RATE: Sed Rate: 137 mm/hr — ABNORMAL HIGH (ref 0–22)

## 2014-11-10 LAB — HEMOGLOBIN A1C
Hgb A1c MFr Bld: 7.5 % — ABNORMAL HIGH (ref 4.8–5.6)
Mean Plasma Glucose: 169 mg/dL

## 2014-11-10 MED ORDER — SODIUM BICARBONATE 650 MG PO TABS
1300.0000 mg | ORAL_TABLET | Freq: Two times a day (BID) | ORAL | Status: DC
  Administered 2014-11-10 – 2014-11-11 (×2): 1300 mg via ORAL
  Filled 2014-11-10 (×3): qty 2

## 2014-11-10 MED ORDER — DIPHENHYDRAMINE HCL 25 MG PO CAPS
25.0000 mg | ORAL_CAPSULE | Freq: Every evening | ORAL | Status: DC | PRN
  Administered 2014-11-10: 25 mg via ORAL
  Filled 2014-11-10: qty 1

## 2014-11-10 MED ORDER — SODIUM BICARBONATE 8.4 % IV SOLN
INTRAVENOUS | Status: DC
  Filled 2014-11-10 (×2): qty 100

## 2014-11-10 NOTE — Progress Notes (Signed)
MD updated on CT report, no new orders, nursing will cont to monitor

## 2014-11-10 NOTE — Progress Notes (Signed)
[facility] TEAM 1 - Stepdown/ICU TEAM Progress Note  Sheri Pearson GEX:[id number] DOB: [birth year] DOA: 11/08/2014 PCP: No primary care provider on file.  Admit HPI / Brief Narrative: 69 year old female with a history of DM 2, hyperlipidemia, and reported CKD stage III who presented to [facility] on 11/03/2014 with a 1 month history of shortness of breath and coughing that had progressively worsened over a period of 2 weeks. 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 The patient was given intravenous hydralazine and labetalol. There was concern the patient had flash pulmonary edema, so she was tx w/ IV furosemide. In addition, the patient complained of intermittent chest discomfort.  Cardiology felt her chest sx were atypical. A Lexiscan was negative. A TTE showed an EF of 60%, but no further detail is available. Her shortness of breath did not improve significantly. A CT of the chest showed bilateral irregular round opacities. Pulmonology was consulted and they felt the working diagnosis was multifocal pneumonia versus BOOP vs malignancy. The patient was started on Vancomycin, Zosyn, and Levofloxacin on 11/06/2014. The patient never had a leukocytosis during her hospitalization, nor did she have a fever. There was consideration for bronchoscopy, but the patient wanted another opinion.  In addition, the patient developed worsening renal function. Nephrology felt the renal failure was due to her hypertensive urgency, furosemide, and a component of ATN. The patient's crt was 6.31 on the day of transfer to [facility]. CPK was 153, urinalysis showed 0-2 WBCs, 300 protein. Her original admission serum creatinine was 2.20 but it was noted that her creatinine was around 1.4 in November 2012. Because of the patient's worsening renal failure and unclear etiology of her pulmonary opacities, she was transferred to [facility].    HPI/Subjective: No new complaints today.  No nausea/vomiting.  No chest pain, abdom pain, or even sob.  Is frustrated that her renal fxn continues to worsen.  Is awake and alert and in no acute distress.    Assessment/Plan:  Acute on chronic renal failure stage 3 Likely multifactorial to include malignant HTN, volume depletion, possible vasculitis, ARB and NSAID (daily ibuprofen) use - FeNa 1.67% - stopped lasix and ARB upon transfer - crt 1.31 2013 - gently hydrating with bicarbonate - no acute indication for hemodialysis presently, but w/ crt climbing may soon get there - I have consulted Renal for diagnostic eval and to be on board should HD become necessary  Pulmonary opacities - Fever to 102.5 - Atypical PNA of unclear etiology  patchy bilateral nodular airspace process noted on CT chest - CT chest here compared to CT chest from [place] (CD found) suggesting no significant change - recurrence of fever as high as 102.5 strongly suggestive of infectious process - resumed antibiotics using Zosyn and vancomycin after blood cultures 2 obtained - has not had a fever since yesterday afternoon - cont w/ current abx and follow clinically - if sx recur, will consider Pulmonary conusult   Diabetes mellitus type 2  CBG currently reasonably controlled - follow  HTN BP currently well controlled  Hypothyroidism Continue home Synthroid dose  HLD  Abdom pain  CT abdom w/o acute findings - sx resolved   Obesity - Body mass index is 44.93 kg/(m^2).  Code Status: FULL Family Communication: no family present at time of visit today  Disposition Plan: SDU  Consultants: Nephrology   Procedures: none  Antibiotics: Zosyn 6/26 > Vancomycin 6/26 >  DVT prophylaxis: Subcutaneous heparin   Objective: Blood pressure 109/51, pulse 83, temperature 98.4 F (36.9 C), temperature source Oral, resp. rate 23, height 5\' 6"  (1.676 m), weight 126.2 kg (278 lb 3.5 oz), SpO2 95  %.  Intake/Output Summary (Last 24 hours) at 11/10/14 1150 Last data filed at 11/10/14 1000  Gross per 24 hour  Intake 1771.25 ml  Output    800 ml  Net 971.25 ml   Exam: General: No acute respiratory distress - alert / conversant Lungs: Clear to auscultation bilaterally without wheezes or crackles - distant bs due to body habitus  Cardiovascular: Regular rate and rhythm without murmur gallop or rub normal S1 and S2 - distant HS  Abdomen: Nontender, obese, soft, bowel sounds positive, no rebound, no ascites, no appreciable mass Extremities: No significant cyanosis, or clubbing;  trace edema bilateral lower extremities  Data Reviewed: Basic Metabolic Panel:  Recent Labs Lab 11/08/14 1927 11/09/14 0230 11/10/14 0245  NA 133* 132* 138  K 5.0 5.3* 4.8  CL 105 106 115*  CO2 18* 17* 17*  GLUCOSE 126* 166* 172*  BUN 66* 70* 77*  CREATININE 6.91* 7.29* 7.61*  CALCIUM 7.2* 7.2* 7.3*  MG 2.2  --   --   PHOS 5.8*  --   --     CBC:  Recent Labs Lab 11/08/14 1927 11/09/14 0230 11/10/14 0245  WBC 7.0 7.4 7.7  NEUTROABS 4.2  --   --   HGB 8.7* 8.6* 8.4*  HCT 27.7* 27.5* 27.0*  MCV 86.6 85.4 86.3  PLT 281 303 313    Liver Function Tests:  Recent Labs Lab 11/08/14 1927 11/10/14 0245  AST 21 25  ALT 18 22  ALKPHOS 36* 31*  BILITOT 0.5 0.4  PROT 8.1 8.0  ALBUMIN 1.9* 1.8*   Cardiac Enzymes:  Recent Labs Lab 11/08/14 1927  CKTOTAL 185    CBG:  Recent Labs Lab 11/09/14 0801 11/09/14 1228 11/09/14 1704 11/09/14 2129 11/10/14 0734  GLUCAP 167* 179* 158* 143* 181*    Recent Results (from the past 240 hour(s))  MRSA PCR Screening     Status: None   Collection Time: 11/08/14  2:00 PM  Result Value Ref Range Status   MRSA by PCR NEGATIVE NEGATIVE Final    Comment:        The GeneXpert MRSA Assay (FDA approved for NASAL specimens only), is one component of a comprehensive MRSA colonization surveillance program. It is not intended to diagnose  MRSA infection nor to guide or monitor treatment for MRSA infections.   Clostridium Difficile by PCR (not at [facility])     Status: None   Collection Time: 11/10/14  8:50 AM  Result Value Ref Range Status   C difficile by pcr NEGATIVE NEGATIVE Final     Studies:   Recent x-ray studies have been reviewed in detail by the Attending Physician  Scheduled Meds:  Scheduled Meds: . carvedilol  6.25 mg Oral BID WC  . heparin subcutaneous  5,000 Units Subcutaneous 3 times per day  . insulin aspart  0-15 Units Subcutaneous TID WC  . insulin aspart  0-5 Units Subcutaneous QHS  . levothyroxine  100 mcg Oral QAC breakfast  . piperacillin-tazobactam (ZOSYN)  IV  2.25 g Intravenous Q8H    Time spent on care of this patient: 35 mins   Daley Gosse T , MD   [facility] Office  [phone] Pager - Text Page per Shea Evans as per below:  On-Call/Text Page:  [email or web address]      password [email or web address]  If 7PM-7AM, please contact night-coverage [email or web address] Password [facility] 11/10/2014, 11:50 AM   LOS: 2 days

## 2014-11-10 NOTE — Clinical Documentation Improvement (Signed)
MD's, NP's, and PA's    Noted work up at outside hospital with possible diagnosis of "PNA"  patient now  with fever and antibiotics resumed. If this diagnosis is appropriate for this admission please document  the type of  PNA.  POA?  Thank you    Possible Clinical Conditions?   Aspiration Pneumonia (POA?) Gram Negative Pneumonia (POA?)  Bacterial pneumonia, specify type if known (POA?) Klebsiella PNA E Coli PNA Pseudomonas PNA Candidiasis PNA Staph/MRSA PNA Strep PNA Pneumococcal PNA H Influenza PNA H Para Influenza PNA  Pneumonia (CAP, HAP) (POA?) Other Condition Cannot Clinically Determine    Risk Factors: Flash pulmonary edema, fever  Signs & Symptoms: SOB x 1 month worsening last 2 weeks   Diagnostics: CT of Chest IMPRESSION: Significant patchy bilateral nodular airspace process with small amount of left pleural fluid. No adenopathy. Findings likely due to an infectious process and less likely inflammatory process or metastatic disease.   Treatment: IV Antibiotics  Thank You, Ree Kida ,RN Clinical Documentation Specialist:  [facility] [employer]

## 2014-11-10 NOTE — Consult Note (Signed)
HPI: I was asked by Dr. Thereasa Solo to see Gracin Soohoo who is a 69 y.o. female with a history of DM 2, hyperlipidemia, and reported CKD stage III who presented to [facility] on 11/03/2014 with a 1 month history of shortness of breath and coughing that had progressively worsened over a period of 2 weeks. 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 The patient was given intravenous hydralazine, labetalol and furosemide. A Lexiscan was negative. A TTE showed an EF of 60%. A CT of the chest showed bilateral irregular round opacities. Pulmonology was consulted and they felt the working diagnosis was multifocal pneumonia versus BOOP vs malignancy. The patient was started on Vancomycin, Zosyn, and Levofloxacin on 11/06/2014. The patient never had a leukocytosis during her hospitalization, nor did she have a fever. There was consideration for bronchoscopy, but the patient wanted another opinion. The patient developed worsening renal function. Nephrology reportedly felt the renal failure was due to her hypertensive urgency and a component of ATN. The patient's creat was 6.31 on the day of transfer to [facility]. Urine on 6/25 revealed $RemoveBefor'100mg'[id number]$  protein and 3-6 RBCs.  Her original admission serum creatinine was 2.20 but it was noted that her creatinine was around 1.4 in November 2012. Because of the patient's worsening renal failure and unclear etiology of her pulmonary opacities, she was transferred to [facility]. She denies hemoptysis or hematuria. ANA and HIV are negative.  She did note some intermittent increased edema for which her diuretics were increased over the past month or so.  She also has had some increased arthralgias but no rash, photophobia or alopecia.  Of note, her deceased father and two brothers had ESRD and received dialysis.  Past Medical History  Diagnosis Date  . Diabetes mellitus without complication   . Hypertension    . Pneumonia    History reviewed. No pertinent past surgical history. Social History:  reports that she has never smoked. She does not have any smokeless tobacco history on file. Her alcohol and drug histories are not on file. Allergies: Not on File History reviewed.  Strong FH of renal disease  Medications:  Prior to Admission:  Prescriptions prior to admission  Medication Sig Dispense Refill Last Dose  . acetaminophen (TYLENOL) 650 MG CR tablet Take 650 mg by mouth every 8 (eight) hours as needed for pain.   Past Month at Unknown time  . aspirin EC 81 MG tablet Take 81 mg by mouth at bedtime.   10/29/2014  . carbamide peroxide (DEBROX) 6.5 % otic solution Place 1 drop into both ears daily as needed (ear wax).   Past Month at Unknown time  . docusate sodium (COLACE) 100 MG capsule Take 100 mg by mouth daily.   10/29/2014  . furosemide (LASIX) 20 MG tablet Take 20 mg by mouth daily as needed for fluid or edema.   Past Month at Unknown time  . insulin aspart (NOVOLOG) 100 UNIT/ML injection Inject 10 Units into the skin 3 (three) times daily before meals. 4-5 units if meal is small.   11/03/2014  . insulin detemir (LEVEMIR) 100 UNIT/ML injection Inject 45 Units into the skin at bedtime.   11/02/2014  . levothyroxine (SYNTHROID, LEVOTHROID) 100 MCG tablet Take 100 mcg by mouth at bedtime.   10/29/2014  . meclizine (ANTIVERT) 25 MG tablet Take 25 mg by mouth 3 (three) times daily as needed for dizziness.   Past Month at Unknown time  .  rosuvastatin (CRESTOR) 10 MG tablet Take 10 mg by mouth at bedtime.   10/29/2014  . verapamil (CALAN) 120 MG tablet Take 120 mg by mouth 2 (two) times daily.   10/29/2014  . carvedilol (COREG) 12.5 MG tablet Take 25 mg by mouth 2 (two) times daily.   0 11/05/2014 at 2300  . MICARDIS 80 MG tablet Take 80 mg by mouth daily. for blood pressure  0 10/29/2014   Scheduled: . carvedilol  6.25 mg Oral BID WC  . heparin subcutaneous  5,000 Units Subcutaneous 3 times per day  .  insulin aspart  0-15 Units Subcutaneous TID WC  . insulin aspart  0-5 Units Subcutaneous QHS  . levothyroxine  100 mcg Oral QAC breakfast  . piperacillin-tazobactam (ZOSYN)  IV  2.25 g Intravenous Q8H   ROS:as per HPI   Blood pressure 142/49, pulse 74, temperature 99 F (37.2 C), temperature source Oral, resp. rate 19, height $RemoveBe'5\' 6"'ZqaLzjaKM$  (1.676 m), weight 126.2 kg (278 lb 3.5 oz), SpO2 98 %.  General appearance: alert and cooperative Head: Normocephalic, without obvious abnormality, atraumatic Eyes: negative Nose: Nares normal. Septum midline. Mucosa normal. No drainage or sinus tenderness. Resp: clear to auscultation bilaterally Chest wall: no tenderness Cardio: regular rate and rhythm, S1, S2 normal, no murmur, click, rub or gallop GI: soft, non-tender; bowel sounds normal; no masses,  no organomegaly Extremities: edema trace Skin: Skin color, texture, turgor normal. No rashes or lesions Neurologic: Grossly normal Results for orders placed or performed during the hospital encounter of 11/08/14 (from the past 48 hour(s))  Comprehensive metabolic panel     Status: Abnormal   Collection Time: 11/08/14  7:27 PM  Result Value Ref Range   Sodium 133 (L) 135 - 145 mmol/L   Potassium 5.0 3.5 - 5.1 mmol/L   Chloride 105 101 - 111 mmol/L   CO2 18 (L) 22 - 32 mmol/L   Glucose, Bld 126 (H) 65 - 99 mg/dL   BUN 66 (H) 6 - 20 mg/dL   Creatinine, Ser 6.91 (H) 0.44 - 1.00 mg/dL   Calcium 7.2 (L) 8.9 - 10.3 mg/dL   Total Protein 8.1 6.5 - 8.1 g/dL   Albumin 1.9 (L) 3.5 - 5.0 g/dL   AST 21 15 - 41 U/L   ALT 18 14 - 54 U/L   Alkaline Phosphatase 36 (L) 38 - 126 U/L   Total Bilirubin 0.5 0.3 - 1.2 mg/dL   GFR calc non Af Amer 6 (L) >60 mL/min   GFR calc Af Amer 6 (L) >60 mL/min    Comment: (NOTE) The eGFR has been calculated using the CKD EPI equation. This calculation has not been validated in all clinical situations. eGFR's persistently <60 mL/min signify possible Chronic Kidney Disease.     Anion gap 10 5 - 15  Phosphorus     Status: Abnormal   Collection Time: 11/08/14  7:27 PM  Result Value Ref Range   Phosphorus 5.8 (H) 2.5 - 4.6 mg/dL  Magnesium     Status: None   Collection Time: 11/08/14  7:27 PM  Result Value Ref Range   Magnesium 2.2 1.7 - 2.4 mg/dL  CBC WITH DIFFERENTIAL     Status: Abnormal   Collection Time: 11/08/14  7:27 PM  Result Value Ref Range   WBC 7.0 4.0 - 10.5 K/uL   RBC 3.20 (L) 3.87 - 5.11 MIL/uL   Hemoglobin 8.7 (L) 12.0 - 15.0 g/dL   HCT 27.7 (L) 36.0 - 46.0 %  MCV 86.6 78.0 - 100.0 fL   MCH 27.2 26.0 - 34.0 pg   MCHC 31.4 30.0 - 36.0 g/dL   RDW 15.5 11.5 - 15.5 %   Platelets 281 150 - 400 K/uL   Neutrophils Relative % 59 43 - 77 %   Neutro Abs 4.2 1.7 - 7.7 K/uL   Lymphocytes Relative 28 12 - 46 %   Lymphs Abs 2.0 0.7 - 4.0 K/uL   Monocytes Relative 10 3 - 12 %   Monocytes Absolute 0.7 0.1 - 1.0 K/uL   Eosinophils Relative 2 0 - 5 %   Eosinophils Absolute 0.1 0.0 - 0.7 K/uL   Basophils Relative 1 0 - 1 %   Basophils Absolute 0.0 0.0 - 0.1 K/uL  TSH     Status: None   Collection Time: 11/08/14  7:27 PM  Result Value Ref Range   TSH 4.208 0.350 - 4.500 uIU/mL  Hemoglobin A1c     Status: Abnormal   Collection Time: 11/08/14  7:27 PM  Result Value Ref Range   Hgb A1c MFr Bld 7.5 (H) 4.8 - 5.6 %    Comment: (NOTE)         Pre-diabetes: 5.7 - 6.4         Diabetes: >6.4         Glycemic control for adults with diabetes: <7.0    Mean Plasma Glucose 169 mg/dL    Comment: (NOTE) Performed At: [facility] [address] [place], [place] [id number] Lindon Romp MD WU:[id number]   Antinuclear Antibodies, IFA     Status: None   Collection Time: 11/08/14  7:27 PM  Result Value Ref Range   ANA Ab, IFA Negative     Comment: (NOTE)                                     Negative   <1:80                                     Borderline  1:80                                     Positive   >1:80 Performed At: [facility] [place], [place] [id number] Lindon Romp MD VH:[id number]   HIV antibody     Status: None   Collection Time: 11/08/14  7:27 PM  Result Value Ref Range   HIV Screen 4th Generation wRfx Non Reactive Non Reactive    Comment: (NOTE) Performed At: [facility] [place], [place] [id number] Lindon Romp MD UV:[id number]   CK     Status: None   Collection Time: 11/08/14  7:27 PM  Result Value Ref Range   Total CK 185 38 - 234 U/L  Lactic acid, plasma     Status: None   Collection Time: 11/08/14  7:27 PM  Result Value Ref Range   Lactic Acid, Venous 0.5 0.5 - 2.0 mmol/L  Glucose, capillary     Status: Abnormal   Collection Time: 11/08/14  9:52 PM  Result Value Ref Range   Glucose-Capillary 116 (H) 65 - 99 mg/dL   Comment 1 Notify RN   Urinalysis, Routine w reflex microscopic (not at  [place])     Status: Abnormal   Collection Time: 11/08/14 10:09 PM  Result Value Ref Range   Color, Urine YELLOW YELLOW   APPearance CLOUDY (A) CLEAR   Specific Gravity, Urine 1.014 1.005 - 1.030   pH 5.0 5.0 - 8.0   Glucose, UA NEGATIVE NEGATIVE mg/dL   Hgb urine dipstick TRACE (A) NEGATIVE   Bilirubin Urine NEGATIVE NEGATIVE   Ketones, ur NEGATIVE NEGATIVE mg/dL   Protein, ur 100 (A) NEGATIVE mg/dL   Urobilinogen, UA 0.2 0.0 - 1.0 mg/dL   Nitrite NEGATIVE NEGATIVE   Leukocytes, UA MODERATE (A) NEGATIVE  Sodium, urine, random     Status: None   Collection Time: 11/08/14 10:09 PM  Result Value Ref Range   Sodium, Ur 35 mmol/L  Creatinine, urine, random     Status: None   Collection Time: 11/08/14 10:09 PM  Result Value Ref Range   Creatinine, Urine 108.83 mg/dL  Urine microscopic-add on     Status: Abnormal   Collection Time: 11/08/14 10:09 PM  Result Value Ref Range   Squamous Epithelial / LPF MANY (A) RARE   WBC, UA 7-10 <3 WBC/hpf   RBC / HPF 3-6 <3 RBC/hpf   Bacteria, UA FEW (A) RARE   Casts GRANULAR CAST (A) NEGATIVE  Lactic acid,  plasma     Status: None   Collection Time: 11/08/14 11:12 PM  Result Value Ref Range   Lactic Acid, Venous 0.7 0.5 - 2.0 mmol/L  Basic metabolic panel     Status: Abnormal   Collection Time: 11/09/14  2:30 AM  Result Value Ref Range   Sodium 132 (L) 135 - 145 mmol/L   Potassium 5.3 (H) 3.5 - 5.1 mmol/L   Chloride 106 101 - 111 mmol/L   CO2 17 (L) 22 - 32 mmol/L   Glucose, Bld 166 (H) 65 - 99 mg/dL   BUN 70 (H) 6 - 20 mg/dL   Creatinine, Ser 7.29 (H) 0.44 - 1.00 mg/dL   Calcium 7.2 (L) 8.9 - 10.3 mg/dL   GFR calc non Af Amer 5 (L) >60 mL/min   GFR calc Af Amer 6 (L) >60 mL/min    Comment: (NOTE) The eGFR has been calculated using the CKD EPI equation. This calculation has not been validated in all clinical situations. eGFR's persistently <60 mL/min signify possible Chronic Kidney Disease.    Anion gap 9 5 - 15  CBC     Status: Abnormal   Collection Time: 11/09/14  2:30 AM  Result Value Ref Range   WBC 7.4 4.0 - 10.5 K/uL   RBC 3.22 (L) 3.87 - 5.11 MIL/uL   Hemoglobin 8.6 (L) 12.0 - 15.0 g/dL   HCT 27.5 (L) 36.0 - 46.0 %   MCV 85.4 78.0 - 100.0 fL   MCH 26.7 26.0 - 34.0 pg   MCHC 31.3 30.0 - 36.0 g/dL   RDW 15.6 (H) 11.5 - 15.5 %   Platelets 303 150 - 400 K/uL  Glucose, capillary     Status: Abnormal   Collection Time: 11/09/14  8:01 AM  Result Value Ref Range   Glucose-Capillary 167 (H) 65 - 99 mg/dL   Comment 1 Notify RN    Comment 2 Document in Chart   Glucose, capillary     Status: Abnormal   Collection Time: 11/09/14 12:28 PM  Result Value Ref Range   Glucose-Capillary 179 (H) 65 - 99 mg/dL   Comment 1 Notify RN    Comment 2 Document in Chart  Culture, blood (routine x 2)     Status: None (Preliminary result)   Collection Time: 11/09/14  4:55 PM  Result Value Ref Range   Specimen Description BLOOD RIGHT HAND    Special Requests BOTTLES DRAWN AEROBIC AND ANAEROBIC 10CC    Culture NO GROWTH < 24 HOURS    Report Status PENDING   Glucose, capillary     Status:  Abnormal   Collection Time: 11/09/14  5:04 PM  Result Value Ref Range   Glucose-Capillary 158 (H) 65 - 99 mg/dL   Comment 1 Notify RN    Comment 2 Document in Chart   Culture, blood (routine x 2)     Status: None (Preliminary result)   Collection Time: 11/09/14  5:05 PM  Result Value Ref Range   Specimen Description BLOOD LEFT HAND    Special Requests BOTTLES DRAWN AEROBIC AND ANAEROBIC 10CC    Culture NO GROWTH < 24 HOURS    Report Status PENDING   Vancomycin, random     Status: None   Collection Time: 11/09/14  5:30 PM  Result Value Ref Range   Vancomycin Rm 21 ug/mL    Comment:        Random Vancomycin therapeutic range is dependent on dosage and time of specimen collection. A peak range is 20.0-40.0 ug/mL A trough range is 5.0-15.0 ug/mL          Glucose, capillary     Status: Abnormal   Collection Time: 11/09/14  9:29 PM  Result Value Ref Range   Glucose-Capillary 143 (H) 65 - 99 mg/dL  Sedimentation rate     Status: Abnormal   Collection Time: 11/10/14  2:45 AM  Result Value Ref Range   Sed Rate 137 (H) 0 - 22 mm/hr  Comprehensive metabolic panel     Status: Abnormal   Collection Time: 11/10/14  2:45 AM  Result Value Ref Range   Sodium 138 135 - 145 mmol/L   Potassium 4.8 3.5 - 5.1 mmol/L   Chloride 115 (H) 101 - 111 mmol/L   CO2 17 (L) 22 - 32 mmol/L   Glucose, Bld 172 (H) 65 - 99 mg/dL   BUN 77 (H) 6 - 20 mg/dL   Creatinine, Ser 7.61 (H) 0.44 - 1.00 mg/dL   Calcium 7.3 (L) 8.9 - 10.3 mg/dL   Total Protein 8.0 6.5 - 8.1 g/dL   Albumin 1.8 (L) 3.5 - 5.0 g/dL   AST 25 15 - 41 U/L   ALT 22 14 - 54 U/L   Alkaline Phosphatase 31 (L) 38 - 126 U/L   Total Bilirubin 0.4 0.3 - 1.2 mg/dL   GFR calc non Af Amer 5 (L) >60 mL/min   GFR calc Af Amer 6 (L) >60 mL/min    Comment: (NOTE) The eGFR has been calculated using the CKD EPI equation. This calculation has not been validated in all clinical situations. eGFR's persistently <60 mL/min signify possible Chronic  Kidney Disease.    Anion gap 6 5 - 15  CBC     Status: Abnormal   Collection Time: 11/10/14  2:45 AM  Result Value Ref Range   WBC 7.7 4.0 - 10.5 K/uL   RBC 3.13 (L) 3.87 - 5.11 MIL/uL   Hemoglobin 8.4 (L) 12.0 - 15.0 g/dL   HCT 27.0 (L) 36.0 - 46.0 %   MCV 86.3 78.0 - 100.0 fL   MCH 26.8 26.0 - 34.0 pg   MCHC 31.1 30.0 - 36.0 g/dL   RDW 15.7 (  H) 11.5 - 15.5 %   Platelets 313 150 - 400 K/uL  Urinalysis, Routine w reflex microscopic (not at [facility])     Status: Abnormal   Collection Time: 11/10/14  3:55 AM  Result Value Ref Range   Color, Urine YELLOW YELLOW   APPearance CLOUDY (A) CLEAR   Specific Gravity, Urine 1.012 1.005 - 1.030   pH 5.0 5.0 - 8.0   Glucose, UA NEGATIVE NEGATIVE mg/dL   Hgb urine dipstick SMALL (A) NEGATIVE   Bilirubin Urine NEGATIVE NEGATIVE   Ketones, ur NEGATIVE NEGATIVE mg/dL   Protein, ur 100 (A) NEGATIVE mg/dL   Urobilinogen, UA 0.2 0.0 - 1.0 mg/dL   Nitrite NEGATIVE NEGATIVE   Leukocytes, UA NEGATIVE NEGATIVE  Urine microscopic-add on     Status: Abnormal   Collection Time: 11/10/14  3:55 AM  Result Value Ref Range   Squamous Epithelial / LPF FEW (A) RARE   WBC, UA 3-6 <3 WBC/hpf   RBC / HPF 3-6 <3 RBC/hpf   Bacteria, UA FEW (A) RARE   Casts HYALINE CASTS (A) NEGATIVE   Urine-Other AMORPHOUS URATES/PHOSPHATES   Glucose, capillary     Status: Abnormal   Collection Time: 11/10/14  7:34 AM  Result Value Ref Range   Glucose-Capillary 181 (H) 65 - 99 mg/dL  Clostridium Difficile by PCR (not at [facility])     Status: None   Collection Time: 11/10/14  8:50 AM  Result Value Ref Range   C difficile by pcr NEGATIVE NEGATIVE  Glucose, capillary     Status: Abnormal   Collection Time: 11/10/14 12:19 PM  Result Value Ref Range   Glucose-Capillary 273 (H) 65 - 99 mg/dL   Comment 1 Notify RN    Comment 2 Document in Chart    Ct Abdomen Pelvis Wo Contrast  11/09/2014   CLINICAL DATA:  Acute and chronic renal failure. Pulmonary infiltrates on chest x-ray.  Patient reports nausea and vomiting 3-4 days. Shortness of breath 1 month. Worsening cough past 2 weeks.  EXAM: CT CHEST, ABDOMEN AND PELVIS WITHOUT CONTRAST  TECHNIQUE: Multidetector CT imaging of the chest, abdomen and pelvis was performed following the standard protocol without IV contrast.  COMPARISON:  None.  FINDINGS: CT CHEST FINDINGS  Lungs are adequately inflated and demonstrate a moderate bilateral patchy nodular airspace process worse over the mid lungs. Tiny amount of left pleural fluid is present. Airways are normal.  Mild cardiomegaly. No definite mediastinal, hilar or axillary adenopathy. Minimal calcified plaque over the thoracic aorta.  There mild degenerate changes of the spine. There is a well-defined oval lucency over the right side of the sternal manubrium.  CT ABDOMEN AND PELVIS FINDINGS  The liver, spleen, pancreas, gallbladder and left adrenal gland are within normal on this noncontrast exam. Minimal prominence of the right adrenal gland. Kidneys are normal in size as there are several small right-sided renal stones mostly within the lower pole collecting system. No evidence of hydronephrosis. Ureters are within normal. Appendix is normal. There is very minimal calcified plaque over the abdominal aorta. No focal inflammatory change or free fluid noted. No evidence of adenopathy.  Pelvic images demonstrate the bladder and rectum to be within normal. Uterus and left ovary are within normal as the right ovary is not well visualized. Mild degenerate changes of the spine.  IMPRESSION: Significant patchy bilateral nodular airspace process with small amount of left pleural fluid. No adenopathy. Findings likely due to an infectious process and less likely inflammatory process or metastatic disease.  Recommend followup CT in 4 weeks.  Right-sided nephrolithiasis.  Mild cardiomegaly.  Single well-defined low-density 1 cm lesion over the right-sided the sternal manubrium. Likely a benign process such as  a hemangioma and less likely metastatic disease.   Electronically Signed   By: Marin Olp M.D.   On: 11/09/2014 07:40   Ct Chest Wo Contrast  11/09/2014   CLINICAL DATA:  Acute and chronic renal failure. Pulmonary infiltrates on chest x-ray. Patient reports nausea and vomiting 3-4 days. Shortness of breath 1 month. Worsening cough past 2 weeks.  EXAM: CT CHEST, ABDOMEN AND PELVIS WITHOUT CONTRAST  TECHNIQUE: Multidetector CT imaging of the chest, abdomen and pelvis was performed following the standard protocol without IV contrast.  COMPARISON:  None.  FINDINGS: CT CHEST FINDINGS  Lungs are adequately inflated and demonstrate a moderate bilateral patchy nodular airspace process worse over the mid lungs. Tiny amount of left pleural fluid is present. Airways are normal.  Mild cardiomegaly. No definite mediastinal, hilar or axillary adenopathy. Minimal calcified plaque over the thoracic aorta.  There mild degenerate changes of the spine. There is a well-defined oval lucency over the right side of the sternal manubrium.  CT ABDOMEN AND PELVIS FINDINGS  The liver, spleen, pancreas, gallbladder and left adrenal gland are within normal on this noncontrast exam. Minimal prominence of the right adrenal gland. Kidneys are normal in size as there are several small right-sided renal stones mostly within the lower pole collecting system. No evidence of hydronephrosis. Ureters are within normal. Appendix is normal. There is very minimal calcified plaque over the abdominal aorta. No focal inflammatory change or free fluid noted. No evidence of adenopathy.  Pelvic images demonstrate the bladder and rectum to be within normal. Uterus and left ovary are within normal as the right ovary is not well visualized. Mild degenerate changes of the spine.  IMPRESSION: Significant patchy bilateral nodular airspace process with small amount of left pleural fluid. No adenopathy. Findings likely due to an infectious process and less likely  inflammatory process or metastatic disease. Recommend followup CT in 4 weeks.  Right-sided nephrolithiasis.  Mild cardiomegaly.  Single well-defined low-density 1 cm lesion over the right-sided the sternal manubrium. Likely a benign process such as a hemangioma and less likely metastatic disease.   Electronically Signed   By: Marin Olp M.D.   On: 11/09/2014 07:40   US Renal  11/09/2014   CLINICAL DATA:  Acute on chronic renal failure.  EXAM: RENAL / URINARY TRACT ULTRASOUND COMPLETE  COMPARISON:  None.  FINDINGS: Right Kidney:  Length: 13.6 cm. Echogenicity within normal limits. No mass or hydronephrosis visualized. 8 mm echogenic focus over the lower pole collecting system which shadowing suggesting a stone.  Left Kidney:  Length: 13.2 cm. Echogenicity within normal limits. No mass or hydronephrosis visualized.  Bladder:  Appears normal for degree of bladder distention.  IMPRESSION: Normal size kidneys without evidence of hydronephrosis. Suggestion of an 8 mm stone over the lower pole collecting system of the right kidney.   Electronically Signed   By: Marin Olp M.D.   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

## 2014-11-10 NOTE — Progress Notes (Signed)
UR COMPLETED  

## 2014-11-10 NOTE — Clinical Documentation Improvement (Signed)
MD's, NP's, and PA's    Noted at outside hospital transfer patient worked up for respiratory issues "Respiratory malingancy, vs BOOP" , please provide documentation if either of these conditions were ruled in or ruled out for this hospital course.  Thank you     Possible Clinical Conditions?  Ruled out BOOP  Ruled in BOOP  Ruled out Respiratory Malignancy  Ruled in Respiratory Malignancy  Other Condition  Unable to Determine clinical condition    Diagnostic : Repeated CT Scan of Chest   Thank Sharlette Dense Takeisha Cianci  Clinical Documentation Specialist: [place]

## 2014-11-11 DIAGNOSIS — J189 Pneumonia, unspecified organism: Secondary | ICD-10-CM | POA: Diagnosis present

## 2014-11-11 DIAGNOSIS — N189 Chronic kidney disease, unspecified: Secondary | ICD-10-CM

## 2014-11-11 DIAGNOSIS — E1165 Type 2 diabetes mellitus with hyperglycemia: Secondary | ICD-10-CM | POA: Diagnosis present

## 2014-11-11 DIAGNOSIS — R918 Other nonspecific abnormal finding of lung field: Secondary | ICD-10-CM

## 2014-11-11 DIAGNOSIS — IMO0002 Reserved for concepts with insufficient information to code with codable children: Secondary | ICD-10-CM | POA: Diagnosis present

## 2014-11-11 LAB — RENAL FUNCTION PANEL
Albumin: 1.9 g/dL — ABNORMAL LOW (ref 3.5–5.0)
Anion gap: 8 (ref 5–15)
BUN: 67 mg/dL — ABNORMAL HIGH (ref 6–20)
CALCIUM: 7.4 mg/dL — AB (ref 8.9–10.3)
CO2: 22 mmol/L (ref 22–32)
Chloride: 107 mmol/L (ref 101–111)
Creatinine, Ser: 5.91 mg/dL — ABNORMAL HIGH (ref 0.44–1.00)
GFR calc non Af Amer: 7 mL/min — ABNORMAL LOW (ref >60)
GFR, EST AFRICAN AMERICAN: 8 mL/min — AB (ref >60)
Glucose, Bld: 220 mg/dL — ABNORMAL HIGH (ref 65–99)
PHOSPHORUS: 5 mg/dL — AB (ref 2.5–4.6)
Potassium: 4.2 mmol/L (ref 3.5–5.1)
Sodium: 137 mmol/L (ref 135–145)

## 2014-11-11 LAB — PROTEIN ELECTROPHORESIS, SERUM
A/G Ratio: 0.4 — ABNORMAL LOW (ref 0.7–1.7)
ALBUMIN ELP: 2.1 g/dL — AB (ref 2.9–4.4)
ALPHA-1-GLOBULIN: 0.3 g/dL (ref 0.0–0.4)
Alpha-2-Globulin: 0.9 g/dL (ref 0.4–1.0)
BETA GLOBULIN: 1.1 g/dL (ref 0.7–1.3)
GLOBULIN, TOTAL: 5.3 g/dL — AB (ref 2.2–3.9)
Gamma Globulin: 3 g/dL — ABNORMAL HIGH (ref 0.4–1.8)
Total Protein ELP: 7.4 g/dL (ref 6.0–8.5)

## 2014-11-11 LAB — C3 COMPLEMENT: C3 COMPLEMENT: 132 mg/dL (ref 82–167)

## 2014-11-11 LAB — GLUCOSE, CAPILLARY
GLUCOSE-CAPILLARY: 248 mg/dL — AB (ref 65–99)
Glucose-Capillary: 220 mg/dL — ABNORMAL HIGH (ref 65–99)
Glucose-Capillary: 233 mg/dL — ABNORMAL HIGH (ref 65–99)
Glucose-Capillary: 169 mg/dL — ABNORMAL HIGH (ref 65–99)

## 2014-11-11 LAB — CBC
HEMATOCRIT: 26.3 % — AB (ref 36.0–46.0)
Hemoglobin: 8.5 g/dL — ABNORMAL LOW (ref 12.0–15.0)
MCH: 27.9 pg (ref 26.0–34.0)
MCHC: 32.3 g/dL (ref 30.0–36.0)
MCV: 86.2 fL (ref 78.0–100.0)
Platelets: 322 10*3/uL (ref 150–400)
RBC: 3.05 MIL/uL — ABNORMAL LOW (ref 3.87–5.11)
RDW: 15.4 % (ref 11.5–15.5)
WBC: 8.9 10*3/uL (ref 4.0–10.5)

## 2014-11-11 LAB — MPO/PR-3 (ANCA) ANTIBODIES
ANCA Proteinase 3: <3.5 U/mL (ref 0.0–3.5)
Myeloperoxidase Abs: <9 U/mL (ref 0.0–9.0)

## 2014-11-11 LAB — QUANTIFERON IN TUBE
QFT TB AG MINUS NIL VALUE: <0 IU/mL
QUANTIFERON MITOGEN VALUE: 7.97 [IU]/mL
QUANTIFERON TB AG VALUE: 0.06 IU/mL
QUANTIFERON TB GOLD: NEGATIVE
Quantiferon Nil Value: 0.07 IU/mL

## 2014-11-11 LAB — C4 COMPLEMENT: Complement C4, Body Fluid: 47 mg/dL — ABNORMAL HIGH (ref 14–44)

## 2014-11-11 LAB — QUANTIFERON TB GOLD ASSAY (BLOOD)

## 2014-11-11 MED ORDER — INSULIN GLARGINE 100 UNIT/ML ~~LOC~~ SOLN
10.0000 [IU] | Freq: Every day | SUBCUTANEOUS | Status: DC
  Administered 2014-11-11 – 2014-11-12 (×2): 10 [IU] via SUBCUTANEOUS
  Filled 2014-11-11 (×4): qty 0.1

## 2014-11-11 MED ORDER — SODIUM CHLORIDE 0.9 % IV SOLN
INTRAVENOUS | Status: DC
  Administered 2014-11-11 – 2014-11-13 (×3): via INTRAVENOUS

## 2014-11-11 MED ORDER — SODIUM BICARBONATE 650 MG PO TABS
650.0000 mg | ORAL_TABLET | Freq: Two times a day (BID) | ORAL | Status: DC
  Administered 2014-11-11 – 2014-11-13 (×4): 650 mg via ORAL
  Filled 2014-11-11 (×5): qty 1

## 2014-11-11 MED ORDER — DM-GUAIFENESIN ER 30-600 MG PO TB12
1.0000 | ORAL_TABLET | Freq: Two times a day (BID) | ORAL | Status: DC
  Administered 2014-11-11 – 2014-11-13 (×4): 1 via ORAL
  Filled 2014-11-11 (×5): qty 1

## 2014-11-11 NOTE — Progress Notes (Signed)
Inpatient Diabetes Program Recommendations  AACE/ADA: New Consensus Statement on Inpatient Glycemic Control (2013)  Target Ranges:  Prepandial:   less than 140 mg/dL      Peak postprandial:   less than 180 mg/dL (1-2 hours)      Critically ill patients:  140 - 180 mg/dL    Inpatient Diabetes Program Recommendations Insulin - Basal: Pt takes levemir 45 units at home. Please consider addiition of low dose levemir at 10 units daily. Diet: Please consider diet order options of renal and carbohydrate modified  Thank you Rosita Kea, RN, MSN, CDE  Diabetes Inpatient Program Office: [phone] Pager: [phone] 8:00 am to 5:00 pm

## 2014-11-11 NOTE — Care Management (Signed)
Important Message  Patient Details  Name: Sheri Pearson MRN: [id number] Date of Birth: February 13, 1946   Medicare Important Message Given:  Yes-second notification given    Nathen May 11/11/2014, 1:29 PM

## 2014-11-11 NOTE — Progress Notes (Signed)
[place] TEAM 1 - Stepdown/ICU TEAM Progress Note  Sheri Pearson JSE:[id number] DOB: 1946/02/29 DOA: 11/08/2014 PCP: No primary care provider on file.  Admit HPI / Brief Narrative: 69 year old BF PMHx DM type 2 uncontrolled, HTN, hyperlipidemia, and reported CKD stage III.  Presented to [facility] on 11/03/2014 with a 1 month history of shortness of breath and coughing that had progressively worsened over a period of 2 weeks. 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 The patient was given intravenous hydralazine and labetalol. There was concern the patient had flash pulmonary edema, so she was tx w/ IV furosemide. In addition, the patient complained of intermittent chest discomfort. Cardiology felt her chest sx were atypical. A Lexiscan was negative. A TTE showed an EF of 60%, but no further detail is available. Her shortness of breath did not improve significantly. A CT of the chest showed bilateral irregular round opacities. Pulmonology was consulted and they felt the working diagnosis was multifocal pneumonia versus BOOP vs malignancy. The patient was started on Vancomycin, Zosyn, and Levofloxacin on 11/06/2014. The patient never had a leukocytosis during her hospitalization, nor did she have a fever. There was consideration for bronchoscopy, but the patient wanted another opinion. In addition, the patient developed worsening renal function. Nephrology felt the renal failure was due to her hypertensive urgency, furosemide, and a component of ATN. The patient's Cr was 6.31 on the day of transfer to [facility] ([facility]). CPK was 153, urinalysis showed 0-2 WBCs, 300 protein. Her original admission serum creatinine was 2.20 but it was noted that her creatinine was around 1.4 in November 2012. Because of the patient's worsening renal failure and unclear etiology of her pulmonary opacities, she was transferred to  [facility].   HPI/Subjective: 6/28 A/O 4, NAD, negative CP, negative SOB, negative N/V  Assessment/Plan:  Acute on chronic renal failure stage 3 (Cr=1.31 in 2013) -Likely multifactorial to include malignant HTN, volume depletion, possible vasculitis, ARB and NSAID (daily ibuprofen) use  - FeNa 1.67%;stopped lasix and ARB upon transfer  -Continue gentle hydration normal saline 75 ml/hr -Per nephrology negative indication for HD   Pulmonary opacities- Atypical PNA of unclear etiology  -patchy bilateral nodular airspace process noted on CT chest -Patient afebrile overnight after antibiotics restarted  -Continue  Zosyn and vancomycin   -Flutter valve -Mucinex DM  Diabetes mellitus type 2 uncontrolled -6/25 hemoglobin A1c = 7.5  -Start Lantus 10 units QHS -Continue moderate SSI  Essential HTN -Continue Coreg 6.25 mg  BID  Hypothyroidism Continue Synthroid 100 g daily  HLD -Lipid panel pending  Abdom pain  -CT abdom w/o acute findings  - sx resolved     Code Status: FULL Family Communication: no family present at time of exam Disposition Plan: Discharge per nephrology    Consultants: Antionette Fairy (nephrology)   Procedure/Significant Events: 6/25 CT chest without contrast;Single well-defined low-density 1 cm lesion over the right-sided the sternal manubrium. Likely a benign process such as a hemangioma  6/26 CT abdomen pelvis without contrast: patchy bilateral nodular airspace process with small amount of left pleural fluid. No adenopathy.    Culture 6/25 MRSA by PCR negative 6/26 blood right/left hand NGTD 6/27 C. difficile by PCR negative   Antibiotics: Zosyn 6/26>> Vancomycin 6/26>>  DVT prophylaxis: Subcutaneous heparin   Devices    LINES / TUBES:      Continuous Infusions:   Objective: VITAL SIGNS: Temp: 98 F (36.7  C) (06/28 1740) Temp Source: Oral (06/28 1740) BP: 139/42 mmHg (06/28 1740) Pulse Rate: 75 (06/28  1740) SPO2; FIO2:   Intake/Output Summary (Last 24 hours) at 11/11/14 1915 Last data filed at 11/11/14 0243  Gross per 24 hour  Intake    170 ml  Output    750 ml  Net   -580 ml     Exam: General: A/O 4, NAD, No acute respiratory distress Eyes: Negative headache, eye pain, double vision, negative retinal hemorrhage ENT: Negative Runny nose, negative gingival bleeding, negative odynophagia Neck:  Negative scars, masses, torticollis, lymphadenopathy, JVD Lungs: Clear to auscultation bilaterally without wheezes or crackles Cardiovascular: Regular rate and rhythm without murmur gallop or rub normal S1 and S2 Abdomen:negative abdominal pain, negative dysphagia, Nontender, nondistended, soft, bowel sounds positive, no rebound, no ascites, no appreciable mass, positive left CVA tenderness Extremities: No significant cyanosis, clubbing, or edema bilateral lower extremities Psychiatric:  Negative depression, negative anxiety, negative fatigue, negative mania  Neurologic:  Cranial nerves II through XII intact, tongue/uvula midline, all extremities muscle strength 5/5, sensation intact throughout, negative dysarthria, negative expressive aphasia, negative receptive aphasia.      Data Reviewed: Basic Metabolic Panel:  Recent Labs Lab 11/08/14 1927 11/09/14 0230 11/10/14 0245 11/11/14 0343  NA 133* 132* 138 137  K 5.0 5.3* 4.8 4.2  CL 105 106 115* 107  CO2 18* 17* 17* 22  GLUCOSE 126* 166* 172* 220*  BUN 66* 70* 77* 67*  CREATININE 6.91* 7.29* 7.61* 5.91*  CALCIUM 7.2* 7.2* 7.3* 7.4*  MG 2.2  --   --   --   PHOS 5.8*  --   --  5.0*   Liver Function Tests:  Recent Labs Lab 11/08/14 1927 11/10/14 0245 11/11/14 0343  AST 21 25  --   ALT 18 22  --   ALKPHOS 36* 31*  --   BILITOT 0.5 0.4  --   PROT 8.1 8.0  --   ALBUMIN 1.9* 1.8* 1.9*   No results for input(s): LIPASE, AMYLASE in the last 168 hours. No results for input(s): AMMONIA in the last 168 hours. CBC:  Recent  Labs Lab 11/08/14 1927 11/09/14 0230 11/10/14 0245 11/11/14 0343  WBC 7.0 7.4 7.7 8.9  NEUTROABS 4.2  --   --   --   HGB 8.7* 8.6* 8.4* 8.5*  HCT 27.7* 27.5* 27.0* 26.3*  MCV 86.6 85.4 86.3 86.2  PLT 281 303 313 322   Cardiac Enzymes:  Recent Labs Lab 11/08/14 1927  CKTOTAL 185   BNP (last 3 results) No results for input(s): BNP in the last 8760 hours.  ProBNP (last 3 results) No results for input(s): PROBNP in the last 8760 hours.  CBG:  Recent Labs Lab 11/10/14 1654 11/10/14 2132 11/11/14 0733 11/11/14 1124 11/11/14 1709  GLUCAP 215* 181* 220* 248* 233*    Recent Results (from the past 240 hour(s))  MRSA PCR Screening     Status: None   Collection Time: 11/08/14  2:00 PM  Result Value Ref Range Status   MRSA by PCR NEGATIVE NEGATIVE Final    Comment:        The GeneXpert MRSA Assay (FDA approved for NASAL specimens only), is one component of a comprehensive MRSA colonization surveillance program. It is not intended to diagnose MRSA infection nor to guide or monitor treatment for MRSA infections.   Culture, blood (routine x 2)     Status: None (Preliminary result)   Collection Time: 11/09/14  4:55 PM  Result Value Ref Range Status   Specimen Description BLOOD RIGHT HAND  Final   Special Requests BOTTLES DRAWN AEROBIC AND ANAEROBIC 10CC  Final   Culture NO GROWTH 2 DAYS  Final   Report Status PENDING  Incomplete  Culture, blood (routine x 2)     Status: None (Preliminary result)   Collection Time: 11/09/14  5:05 PM  Result Value Ref Range Status   Specimen Description BLOOD LEFT HAND  Final   Special Requests BOTTLES DRAWN AEROBIC AND ANAEROBIC 10CC  Final   Culture NO GROWTH 2 DAYS  Final   Report Status PENDING  Incomplete  Clostridium Difficile by PCR (not at [facility])     Status: None   Collection Time: 11/10/14  8:50 AM  Result Value Ref Range Status   C difficile by pcr NEGATIVE NEGATIVE Final     Studies:  Recent x-ray studies have been  reviewed in detail by the Attending Physician  Scheduled Meds:  Scheduled Meds: . carvedilol  6.25 mg Oral BID WC  . heparin subcutaneous  5,000 Units Subcutaneous 3 times per day  . insulin aspart  0-15 Units Subcutaneous TID WC  . insulin aspart  0-5 Units Subcutaneous QHS  . levothyroxine  100 mcg Oral QAC breakfast  . piperacillin-tazobactam (ZOSYN)  IV  2.25 g Intravenous Q8H  . sodium bicarbonate  650 mg Oral BID    Time spent on care of this patient: 40 mins   Lizzette Carbonell, Geraldo Docker , MD  [facility] Office  [phone] Pager - [phone]  On-Call/Text Page:      [email or web address]      password [email or web address]  If 7PM-7AM, please contact night-coverage [email or web address] Password [email or web address] 11/11/2014, 7:15 PM   LOS: 3 days   Care during the described time interval was provided by me .  I have reviewed this patient's available data, including medical history, events of note, physical examination, and all test results as part of my evaluation. I have personally reviewed and interpreted all radiology studies.   Dia Crawford, MD [phone] Pager

## 2014-11-11 NOTE — Progress Notes (Signed)
Assessment:  1 AKI in setting of uncertain lung pathology, proteinuria and microscopic hematuria, r/o pulmonary-renal syndrome(ANCA, antiGBM, post infectious, immune complex mediated, ATN )....... improving renal function suggesting ATN likely 2 Pulmonary infiltrates ? 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 5 CKD 6 Strong FH of ESRD  Plan: 1 Follow for recovery of renal fct & may need to diurese but occuring spontaeously  Subjective: Interval History: Breathing better, coughing less  Objective: Vital signs in last 24 hours: Temp:  [97.2 F (36.2 C)-99 F (37.2 C)] 98 F (36.7 C) (06/28 1121) Pulse Rate:  [73-83] 73 (06/28 1120) Resp:  [4-29] 10 (06/28 1120) BP: (125-159)/(38-53) 125/38 mmHg (06/28 1120) SpO2:  [94 %-99 %] 97 % (06/28 1120) Weight change:   Intake/Output from previous day: 06/27 0701 - 06/28 0700 In: 370 [P.O.:120; I.V.:150; IV Piggyback:100] Out: 1625 [Urine:1625] Intake/Output this shift:    General appearance: alert and cooperative Resp: clear to auscultation bilaterally Chest wall: no tenderness Cardio: regular rate and rhythm, S1, S2 normal, no murmur, click, rub or gallop Extremities: edema tr  Lab Results:  Recent Labs  11/10/14 0245 11/11/14 0343  WBC 7.7 8.9  HGB 8.4* 8.5*  HCT 27.0* 26.3*  PLT 313 322   BMET:  Recent Labs  11/10/14 0245 11/11/14 0343  NA 138 137  K 4.8 4.2  CL 115* 107  CO2 17* 22  GLUCOSE 172* 220*  BUN 77* 67*  CREATININE 7.61* 5.91*  CALCIUM 7.3* 7.4*   No results for input(s): PTH in the last 72 hours. Iron Studies: No results for input(s): IRON, TIBC, TRANSFERRIN, FERRITIN in the last 72 hours. Studies/Results: Dg Chest 2 View  11/10/2014   CLINICAL DATA:  Renal failure, pulmonary infiltrates and nodular airspace disease.  EXAM: CHEST - 2 VIEW  COMPARISON:  CT of the chest on 11/08/2014  FINDINGS: Patchy areas of bilateral pulmonary airspace  disease remain present. Compared to the prior chest CT, this process is likely improving and findings are consistent with bilateral infection versus atypical appearance of pulmonary edema. The heart size is at the upper limits of normal. No pleural effusions are identified. Diffuse spondylosis present of the thoracic spine.  IMPRESSION: Likely improving nodular airspace disease in both lungs compared to CT appearance 2 days ago. Findings are consistent with bilateral pneumonia versus atypical appearance of pulmonary edema.   Electronically Signed   By: Aletta Edouard M.D.   On: 11/10/2014 18:19   US Renal  11/09/2014   CLINICAL DATA:  Acute on chronic renal failure.  EXAM: RENAL / URINARY TRACT ULTRASOUND COMPLETE  COMPARISON:  None.  FINDINGS: Right Kidney:  Length: 13.6 cm. Echogenicity within normal limits. No mass or hydronephrosis visualized. 8 mm echogenic focus over the lower pole collecting system which shadowing suggesting a stone.  Left Kidney:  Length: 13.2 cm. Echogenicity within normal limits. No mass or hydronephrosis visualized.  Bladder:  Appears normal for degree of bladder distention.  IMPRESSION: Normal size kidneys without evidence of hydronephrosis. Suggestion of an 8 mm stone over the lower pole collecting system of the right kidney.   Electronically Signed   By: Marin Olp M.D.   On: 11/09/2014 12:57   Scheduled: . carvedilol  6.25 mg Oral BID WC  . heparin subcutaneous  5,000 Units Subcutaneous 3 times per day  . insulin aspart  0-15 Units Subcutaneous TID WC  . insulin aspart  0-5 Units Subcutaneous QHS  . levothyroxine  100 mcg Oral QAC breakfast  . piperacillin-tazobactam (ZOSYN)  IV  2.25 g Intravenous Q8H  . sodium bicarbonate  1,300 mg Oral BID     LOS: 3 days   Merrie Epler C 11/11/2014,11:44 AM

## 2014-11-12 DIAGNOSIS — E785 Hyperlipidemia, unspecified: Secondary | ICD-10-CM

## 2014-11-12 DIAGNOSIS — E1165 Type 2 diabetes mellitus with hyperglycemia: Secondary | ICD-10-CM

## 2014-11-12 DIAGNOSIS — I10 Essential (primary) hypertension: Secondary | ICD-10-CM

## 2014-11-12 DIAGNOSIS — N179 Acute kidney failure, unspecified: Secondary | ICD-10-CM

## 2014-11-12 DIAGNOSIS — N183 Chronic kidney disease, stage 3 (moderate): Secondary | ICD-10-CM

## 2014-11-12 LAB — LIPID PANEL
CHOL/HDL RATIO: 6.7 ratio
Cholesterol: 154 mg/dL (ref 0–200)
HDL: 23 mg/dL — ABNORMAL LOW (ref >40)
LDL Cholesterol: 102 mg/dL — ABNORMAL HIGH (ref 0–99)
Triglycerides: 147 mg/dL (ref <150)
VLDL: 29 mg/dL (ref 0–40)

## 2014-11-12 LAB — CBC WITH DIFFERENTIAL/PLATELET
BASOS PCT: 1 % (ref 0–1)
Basophils Absolute: 0.1 10*3/uL (ref 0.0–0.1)
EOS ABS: 0.3 10*3/uL (ref 0.0–0.7)
Eosinophils Relative: 4 % (ref 0–5)
HEMATOCRIT: 26 % — AB (ref 36.0–46.0)
Hemoglobin: 8.2 g/dL — ABNORMAL LOW (ref 12.0–15.0)
LYMPHS ABS: 1.3 10*3/uL (ref 0.7–4.0)
Lymphocytes Relative: 16 % (ref 12–46)
MCH: 27.8 pg (ref 26.0–34.0)
MCHC: 31.5 g/dL (ref 30.0–36.0)
MCV: 88.1 fL (ref 78.0–100.0)
MONO ABS: 0.7 10*3/uL (ref 0.1–1.0)
Monocytes Relative: 8 % (ref 3–12)
NEUTROS PCT: 73 % (ref 43–77)
Neutro Abs: 6.2 10*3/uL (ref 1.7–7.7)
Platelets: 332 10*3/uL (ref 150–400)
RBC: 2.95 MIL/uL — AB (ref 3.87–5.11)
RDW: 15.4 % (ref 11.5–15.5)
WBC: 8.5 10*3/uL (ref 4.0–10.5)

## 2014-11-12 LAB — GLUCOSE, CAPILLARY
Glucose-Capillary: 219 mg/dL — ABNORMAL HIGH (ref 65–99)
Glucose-Capillary: 140 mg/dL — ABNORMAL HIGH (ref 65–99)
Glucose-Capillary: 169 mg/dL — ABNORMAL HIGH (ref 65–99)

## 2014-11-12 LAB — COMPREHENSIVE METABOLIC PANEL
ALT: 22 U/L (ref 14–54)
AST: 22 U/L (ref 15–41)
Albumin: 1.9 g/dL — ABNORMAL LOW (ref 3.5–5.0)
Alkaline Phosphatase: 33 U/L — ABNORMAL LOW (ref 38–126)
Anion gap: 11 (ref 5–15)
BILIRUBIN TOTAL: 0.3 mg/dL (ref 0.3–1.2)
BUN: 56 mg/dL — ABNORMAL HIGH (ref 6–20)
CALCIUM: 7.6 mg/dL — AB (ref 8.9–10.3)
CHLORIDE: 106 mmol/L (ref 101–111)
CO2: 22 mmol/L (ref 22–32)
Creatinine, Ser: 5.33 mg/dL — ABNORMAL HIGH (ref 0.44–1.00)
GFR, EST AFRICAN AMERICAN: 9 mL/min — AB (ref >60)
GFR, EST NON AFRICAN AMERICAN: 8 mL/min — AB (ref >60)
GLUCOSE: 183 mg/dL — AB (ref 65–99)
Potassium: 4.4 mmol/L (ref 3.5–5.1)
SODIUM: 139 mmol/L (ref 135–145)
Total Protein: 8.2 g/dL — ABNORMAL HIGH (ref 6.5–8.1)

## 2014-11-12 LAB — GLOMERULAR BASEMENT MEMBRANE ANTIBODIES: GBM Ab: 11 units (ref 0–20)

## 2014-11-12 LAB — VANCOMYCIN, RANDOM: VANCOMYCIN RM: 24 ug/mL

## 2014-11-12 MED ORDER — LEVOFLOXACIN 500 MG PO TABS
500.0000 mg | ORAL_TABLET | ORAL | Status: DC
  Administered 2014-11-12: 500 mg via ORAL
  Filled 2014-11-12: qty 1

## 2014-11-12 NOTE — Evaluation (Signed)
Physical Therapy Evaluation and D/C Patient Details Name: Sheri Pearson MRN: [id number] DOB: May 18, 1945 Today's Date: 11/12/2014   History of Present Illness  69 year old female with a history of DM 2, hyperlipidemia, and reported CKD stage III who presented to [facility] on 11/03/2014 with a 1 month history of shortness of breath and coughing that had progressively worsened over a period of 2 weeks. 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 The patient was given intravenous hydralazine and labetalol. There was concern the patient had flash pulmonary edema, so she was tx w/ IV furosemide. In addition, the patient complained of intermittent chest discomfort. Cardiology felt her chest sx were atypical. A Lexiscan was negative. A TTE showed an EF of 60%, but no further detail is available. Her shortness of breath did not improve significantly.   Clinical Impression  Pt admitted with above diagnosis. Pt currently without significant functional limitations at present in hospital as pt was able to ambulate without device.  Some fatigue noted therefore feel that HHPT f/u would benefit pt to ensure that pt does not get weaker on d/c home and that home is set up safely for pt.  Will not follow in hospital as pt is independent in this controlled environment.   Pt will not need any further skilled PT in hospital.  Sign off.    Follow Up Recommendations Home health PT;Supervision - Intermittent (home safety eval)    Equipment Recommendations  None recommended by PT    Recommendations for Other Services       Precautions / Restrictions Precautions Precautions: None Restrictions Weight Bearing Restrictions: No      Mobility  Bed Mobility Overal bed mobility: Independent                Transfers Overall transfer level: Independent                  Ambulation/Gait Ambulation/Gait  assistance: Supervision Ambulation Distance (Feet): 450 Feet Assistive device: None Gait Pattern/deviations: WFL(Within Functional Limits)   Gait velocity interpretation: <1.8 ft/sec, indicative of risk for recurrent falls General Gait Details: No LOB with min challenges.    Stairs            Wheelchair Mobility    Modified Rankin (Stroke Patients Only)       Balance Overall balance assessment: Independent                               Standardized Balance Assessment Standardized Balance Assessment : Dynamic Gait Index   Dynamic Gait Index Level Surface: Normal Change in Gait Speed: Normal Gait with Horizontal Head Turns: Normal Gait with Vertical Head Turns: Normal Gait and Pivot Turn: Normal Step Over Obstacle: Mild Impairment Step Around Obstacles: Normal Steps: Mild Impairment Total Score: 22       Pertinent Vitals/Pain Pain Assessment: No/denies pain  VSS    Home Living Family/patient expects to be discharged to:: Private residence Living Arrangements: Other relatives Available Help at Discharge: Family;Available 24 hours/day Type of Home: House Home Access: Level entry     Home Layout: One level Home Equipment: None      Prior Function Level of Independence: Independent               Hand Dominance        Extremity/Trunk Assessment   Upper Extremity Assessment: Defer to OT  evaluation           Lower Extremity Assessment: Overall WFL for tasks assessed      Cervical / Trunk Assessment: Normal  Communication   Communication: No difficulties  Cognition Arousal/Alertness: Awake/alert Behavior During Therapy: WFL for tasks assessed/performed Overall Cognitive Status: Within Functional Limits for tasks assessed                      General Comments General comments (skin integrity, edema, etc.): Scores 22/24 on DGI.  Very low risk of falls per testing.     Exercises        Assessment/Plan    PT  Assessment All further PT needs can be met in the next venue of care  PT Diagnosis Generalized weakness   PT Problem List Decreased safety awareness;Decreased activity tolerance  PT Treatment Interventions     PT Goals (Current goals can be found in the Care Plan section) Acute Rehab PT Goals Patient Stated Goal: to go home PT Goal Formulation: All assessment and education complete, DC therapy    Frequency     Barriers to discharge        Co-evaluation               End of Session Equipment Utilized During Treatment: Gait belt Activity Tolerance: Patient limited by fatigue Patient left: in chair;with call bell/phone within reach;with family/visitor present Nurse Communication: Mobility status         Time: 6190-1222 PT Time Calculation (min) (ACUTE ONLY): 11 min   Charges:   PT Evaluation $Initial PT Evaluation Tier I: 1 Procedure     PT G CodesDenice Paradise 23-Nov-2014, 3:43 PM [facility] Acute Rehabilitation [phone] (pager)

## 2014-11-12 NOTE — Progress Notes (Signed)
Progress Note  Sheri Pearson IWP:[id number] DOB: [birth year] DOA: 11/08/2014 PCP: No primary care provider on file.  Admit HPI / Brief Narrative:  69 year old female with a history of DM 2, hyperlipidemia, and reported CKD stage III who presented to [facility] on 11/03/2014 with a 1 month history of shortness of breath and coughing that had progressively worsened over a period of 2 weeks. 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 The patient was given intravenous hydralazine and labetalol. There was concern the patient had flash pulmonary edema, so she was tx w/ IV furosemide. In addition, the patient complained of intermittent chest discomfort.  Cardiology felt her chest sx were atypical. A Lexiscan was negative. A TTE showed an EF of 60%, but no further detail is available. Her shortness of breath did not improve significantly.   A CT of the chest showed bilateral irregular round opacities. Pulmonology was consulted and they felt the working diagnosis was multifocal pneumonia versus BOOP vs malignancy. The patient was started on Vancomycin, Zosyn, and Levofloxacin on 11/06/2014. The patient never had a leukocytosis during her hospitalization, nor did she have a fever. There was consideration for bronchoscopy, but the patient wanted another opinion.  In addition, the patient developed worsening renal function. Nephrology felt the renal failure was due to her hypertensive urgency, furosemide, and a component of ATN. The patient's crt was 6.31 on the day of transfer to [facility]. CPK was 153, urinalysis showed 0-2 WBCs, 300 protein. Her original admission serum creatinine was 2.20 but it was noted that her creatinine was around 1.4 in November 2012. Because of the patient's worsening renal failure and unclear etiology of her pulmonary opacities, she was transferred to [facility].      HPI/Subjective:  No new complaints today.  No nausea/vomiting.  No chest pain, abdom pain, or even sob.  Feels better today.    Assessment/Plan:  Acute on chronic renal failure stage 3 Likely multifactorial to include malignant HTN, volume depletion, possible vasculitis, ARB and NSAID (daily ibuprofen) use - FeNa 1.67% - stopped lasix and ARB upon transfer - crt 1.31 2013 - gently hydrating with bicarbonate - no acute indication for hemodialysis presently, renal following creatinine now improving.    Pulmonary opacities - Fever to 102.5 - Atypical PNA of unclear etiology  patchy bilateral nodular airspace process noted on CT chest - CT chest here compared to CT chest from [facility] (CD found) suggesting no significant change - recurrence of fever as high as 102.5 strongly suggestive of infectious process - was on IV vancomycin and Zosyn now transitioned to Levaquin on 11/12/2014 continue to monitor. Clinically better cultures negative.   HTN BP currently well controlled  Hypothyroidism Continue home Synthroid dose  HLD  Abdom pain  CT abdom w/o acute findings - sx resolved   Obesity - Body mass index is 44.93 kg/(m^2).   Diabetes mellitus type 2  CBG currently reasonably controlled - on Lantus with sliding scale, A1c was 7.5  CBG (last 3)   Recent Labs  11/11/14 1709 11/11/14 2053 11/12/14 0737  GLUCAP 233* 169* 219*      Code Status: FULL Family Communication: no family present at time of visit today  Disposition Plan: SDU  Consultants: Nephrology   Procedures: 6/25 CT chest without contrast;Single well-defined low-density 1 cm lesion over the right-sided the sternal manubrium. Likely a benign process such as a hemangioma  6/26 CT abdomen pelvis  without contrast: patchy bilateral nodular airspace process with small amount of left pleural fluid. No adenopathy.   Anti-infectives    Start     Dose/Rate Route Frequency Ordered Stop   11/12/14 1200   levofloxacin (LEVAQUIN) tablet 500 mg     500 mg Oral Every 48 hours 11/12/14 1019     11/09/14 2300  vancomycin (VANCOCIN) 1,500 mg in sodium chloride 0.9 % 500 mL IVPB     1,500 mg 250 mL/hr over 120 Minutes Intravenous  Once 11/09/14 2109 11/10/14 0221   11/09/14 1700  piperacillin-tazobactam (ZOSYN) IVPB 2.25 g  Status:  Discontinued     2.25 g 100 mL/hr over 30 Minutes Intravenous Every 8 hours 11/09/14 1640 11/12/14 1020       DVT prophylaxis: Subcutaneous heparin   Objective: Blood pressure 127/53, pulse 71, temperature 98 F (36.7 C), temperature source Oral, resp. rate 18, height 5\' 6"  (1.676 m), weight 126.2 kg (278 lb 3.5 oz), SpO2 98 %.  Intake/Output Summary (Last 24 hours) at 11/12/14 1222 Last data filed at 11/12/14 0933  Gross per 24 hour  Intake    480 ml  Output   1201 ml  Net   -721 ml   Exam: General: No acute respiratory distress - alert / conversant Lungs: Clear to auscultation bilaterally without wheezes or crackles - distant bs due to body habitus  Cardiovascular: Regular rate and rhythm without murmur gallop or rub normal S1 and S2 - distant HS  Abdomen: Nontender, obese, soft, bowel sounds positive, no rebound, no ascites, no appreciable mass Extremities: No significant cyanosis, or clubbing;  trace edema bilateral lower extremities  Data Reviewed: Basic Metabolic Panel:  Recent Labs Lab 11/08/14 1927 11/09/14 0230 11/10/14 0245 11/11/14 0343 11/12/14 0535  NA 133* 132* 138 137 139  K 5.0 5.3* 4.8 4.2 4.4  CL 105 106 115* 107 106  CO2 18* 17* 17* 22 22  GLUCOSE 126* 166* 172* 220* 183*  BUN 66* 70* 77* 67* 56*  CREATININE 6.91* 7.29* 7.61* 5.91* 5.33*  CALCIUM 7.2* 7.2* 7.3* 7.4* 7.6*  MG 2.2  --   --   --   --   PHOS 5.8*  --   --  5.0*  --     CBC:  Recent Labs Lab 11/08/14 1927 11/09/14 0230 11/10/14 0245 11/11/14 0343 11/12/14 0535  WBC 7.0 7.4 7.7 8.9 8.5  NEUTROABS 4.2  --   --   --  6.2  HGB 8.7* 8.6* 8.4* 8.5* 8.2*   HCT 27.7* 27.5* 27.0* 26.3* 26.0*  MCV 86.6 85.4 86.3 86.2 88.1  PLT 281 303 313 322 332    Liver Function Tests:  Recent Labs Lab 11/08/14 1927 11/10/14 0245 11/11/14 0343 11/12/14 0535  AST 21 25  --  22  ALT 18 22  --  22  ALKPHOS 36* 31*  --  33*  BILITOT 0.5 0.4  --  0.3  PROT 8.1 8.0  --  8.2*  ALBUMIN 1.9* 1.8* 1.9* 1.9*   Cardiac Enzymes:  Recent Labs Lab 11/08/14 1927  CKTOTAL 185    CBG:  Recent Labs Lab 11/11/14 0733 11/11/14 1124 11/11/14 1709 11/11/14 2053 11/12/14 0737  GLUCAP 220* 248* 233* 169* 219*    Recent Results (from the past 240 hour(s))  MRSA PCR Screening     Status: None   Collection Time: 11/08/14  2:00 PM  Result Value Ref Range Status   MRSA by PCR NEGATIVE NEGATIVE Final    Comment:  The GeneXpert MRSA Assay (FDA approved for NASAL specimens only), is one component of a comprehensive MRSA colonization surveillance program. It is not intended to diagnose MRSA infection nor to guide or monitor treatment for MRSA infections.   Culture, blood (routine x 2)     Status: None (Preliminary result)   Collection Time: 11/09/14  4:55 PM  Result Value Ref Range Status   Specimen Description BLOOD RIGHT HAND  Final   Special Requests BOTTLES DRAWN AEROBIC AND ANAEROBIC 10CC  Final   Culture NO GROWTH 2 DAYS  Final   Report Status PENDING  Incomplete  Culture, blood (routine x 2)     Status: None (Preliminary result)   Collection Time: 11/09/14  5:05 PM  Result Value Ref Range Status   Specimen Description BLOOD LEFT HAND  Final   Special Requests BOTTLES DRAWN AEROBIC AND ANAEROBIC 10CC  Final   Culture NO GROWTH 2 DAYS  Final   Report Status PENDING  Incomplete  Clostridium Difficile by PCR (not at [facility])     Status: None   Collection Time: 11/10/14  8:50 AM  Result Value Ref Range Status   C difficile by pcr NEGATIVE NEGATIVE Final      Scheduled Meds:  Scheduled Meds: . carvedilol  6.25 mg Oral BID WC  .  dextromethorphan-guaiFENesin  1 tablet Oral BID  . heparin subcutaneous  5,000 Units Subcutaneous 3 times per day  . insulin aspart  0-15 Units Subcutaneous TID WC  . insulin aspart  0-5 Units Subcutaneous QHS  . insulin glargine  10 Units Subcutaneous QHS  . levofloxacin  500 mg Oral Q48H  . levothyroxine  100 mcg Oral QAC breakfast  . sodium bicarbonate  650 mg Oral BID    Time spent on care of this patient: 35 mins  Lala Lund K M.D on 11/12/2014 at 12:23 PM  Between 7am to 7pm - Pager - [phone], After 7pm go to [email or web address] - password [facility]  [facility]  Office  [phone]  LOS: 4 days

## 2014-11-12 NOTE — Progress Notes (Signed)
Patient transferred from 3S. Vital signs stable, orders reviewed and implemented, assessment completed, and patient oriented to unit and unit staff. Patient lying in bed with no needs stated at this time. Call light within reach, will continue to monitor.   Shelbie Hutching, RN, BSN

## 2014-11-12 NOTE — Progress Notes (Signed)
ANTIBIOTIC CONSULT NOTE  Pharmacy Consult for vancomycin + zosyn Indication: rule out pneumonia  Not on File  Patient Measurements: Height: 5\' 6"  (167.6 cm) Weight: 278 lb 3.5 oz (126.2 kg) IBW/kg (Calculated) : 59.3 Adjusted Body Weight:   Vital Signs: Temp: 98 F (36.7 C) (06/29 0933) Temp Source: Oral (06/29 0933) BP: 127/53 mmHg (06/29 0933) Pulse Rate: 71 (06/29 0933) Intake/Output from previous day: 06/28 0701 - 06/29 0700 In: 240 [P.O.:240] Out: 700 [Urine:700] Intake/Output from this shift: Total I/O In: 240 [P.O.:240] Out: 501 [Urine:500; Stool:1]  Labs:  Recent Labs  11/10/14 0245 11/11/14 0343 11/12/14 0535  WBC 7.7 8.9 8.5  HGB 8.4* 8.5* 8.2*  PLT 313 322 332  CREATININE 7.61* 5.91* 5.33*   Estimated Creatinine Clearance: 14.1 mL/min (by C-G formula based on Cr of 5.33).  Recent Labs  11/09/14 1730 11/12/14 0525  VANCORANDOM 21 24     Microbiology: Recent Results (from the past 100 hour(s))  MRSA PCR Screening     Status: None   Collection Time: 11/08/14  2:00 PM  Result Value Ref Range Status   MRSA by PCR NEGATIVE NEGATIVE Final    Comment:        The GeneXpert MRSA Assay (FDA approved for NASAL specimens only), is one component of a comprehensive MRSA colonization surveillance program. It is not intended to diagnose MRSA infection nor to guide or monitor treatment for MRSA infections.   Culture, blood (routine x 2)     Status: None (Preliminary result)   Collection Time: 11/09/14  4:55 PM  Result Value Ref Range Status   Specimen Description BLOOD RIGHT HAND  Final   Special Requests BOTTLES DRAWN AEROBIC AND ANAEROBIC 10CC  Final   Culture NO GROWTH 2 DAYS  Final   Report Status PENDING  Incomplete  Culture, blood (routine x 2)     Status: None (Preliminary result)   Collection Time: 11/09/14  5:05 PM  Result Value Ref Range Status   Specimen Description BLOOD LEFT HAND  Final   Special Requests BOTTLES DRAWN AEROBIC AND  ANAEROBIC 10CC  Final   Culture NO GROWTH 2 DAYS  Final   Report Status PENDING  Incomplete  Clostridium Difficile by PCR (not at [place])     Status: None   Collection Time: 11/10/14  8:50 AM  Result Value Ref Range Status   C difficile by pcr NEGATIVE NEGATIVE Final    Medical History: Past Medical History  Diagnosis Date  . Diabetes mellitus without complication   . Hypertension   . Pneumonia     Medications:  Anti-infectives    Start     Dose/Rate Route Frequency Ordered Stop   11/12/14 1200  levofloxacin (LEVAQUIN) tablet 500 mg     500 mg Oral Every 48 hours 11/12/14 1019     11/09/14 2300  vancomycin (VANCOCIN) 1,500 mg in sodium chloride 0.9 % 500 mL IVPB     1,500 mg 250 mL/hr over 120 Minutes Intravenous  Once 11/09/14 2109 11/10/14 0221   11/09/14 1700  piperacillin-tazobactam (ZOSYN) IVPB 2.25 g  Status:  Discontinued     2.25 g 100 mL/hr over 30 Minutes Intravenous Every 8 hours 11/09/14 1640 11/12/14 1020     Assessment: 41 yof transferred from OSH yesterday for worsening renal function. To restart antibiotics for treatment of pneumonia. Scr has rapidly increased to 7.29. She was started on vancomycin and zosyn at OSH but were not resumed until 6/26. Pt received one dose of vancomycin  on 6/26, a level drawn today is SUPRAtherapeutic, due to prongloged half life with AKI.  VR 6/26 = 21 VR 6/29 = 24  Vanc 6/23>>6/25; 6/26>> Zosyn 6/23 >>6/26; 6/26>>  Goal of Therapy:  Vancomycin trough level 15-20 mcg/ml  Plan:  -Change abx to levaquin 500 mg PO q48h    Jaquay Posthumus, Bryson Ha M 11/12/2014,10:24 AM

## 2014-11-12 NOTE — Progress Notes (Signed)
Assessment:  1 AKI .Marland Kitchen... improving renal function suggesting ATN, recovery 2 Pulmonary infiltrates ? 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  Objective: Vital signs in last 24 hours: Temp:  [98 F (36.7 C)-99.2 F (37.3 C)] 98 F (36.7 C) (06/29 0933) Pulse Rate:  [64-80] 71 (06/29 0933) Resp:  [18-26] 18 (06/29 0933) BP: (127-145)/(42-58) 127/53 mmHg (06/29 0933) SpO2:  [93 %-98 %] 98 % (06/29 0933) Weight change:   Intake/Output from previous day: 06/28 0701 - 06/29 0700 In: 240 [P.O.:240] Out: 700 [Urine:700] Intake/Output this shift: Total I/O In: 480 [P.O.:480] Out: 501 [Urine:500; Stool:1]  General appearance: alert and cooperative Back: negative, symmetric, no curvature. ROM normal. No CVA tenderness. Resp: clear to auscultation bilaterally Cardio: regular rate and rhythm, S1, S2 normal, no murmur, click, rub or gallop Extremities: edema 1-2+  Lab Results:  Recent Labs  11/11/14 0343 11/12/14 0535  WBC 8.9 8.5  HGB 8.5* 8.2*  HCT 26.3* 26.0*  PLT 322 332   BMET:  Recent Labs  11/11/14 0343 11/12/14 0535  NA 137 139  K 4.2 4.4  CL 107 106  CO2 22 22  GLUCOSE 220* 183*  BUN 67* 56*  CREATININE 5.91* 5.33*  CALCIUM 7.4* 7.6*   No results for input(s): PTH in the last 72 hours. Iron Studies: No results for input(s): IRON, TIBC, TRANSFERRIN, FERRITIN in the last 72 hours. Studies/Results: Dg Chest 2 View  11/10/2014   CLINICAL DATA:  Renal failure, pulmonary infiltrates and nodular airspace disease.  EXAM: CHEST - 2 VIEW  COMPARISON:  CT of the chest on 11/08/2014  FINDINGS: Patchy areas of bilateral pulmonary airspace disease remain present. Compared to the prior chest CT, this process is likely improving and findings are consistent with bilateral infection versus  atypical appearance of pulmonary edema. The heart size is at the upper limits of normal. No pleural effusions are identified. Diffuse spondylosis present of the thoracic spine.  IMPRESSION: Likely improving nodular airspace disease in both lungs compared to CT appearance 2 days ago. Findings are consistent with bilateral pneumonia versus atypical appearance of pulmonary edema.   Electronically Signed   By: Aletta Edouard M.D.   On: 11/10/2014 18:19    Scheduled: . carvedilol  6.25 mg Oral BID WC  . dextromethorphan-guaiFENesin  1 tablet Oral BID  . heparin subcutaneous  5,000 Units Subcutaneous 3 times per day  . insulin aspart  0-15 Units Subcutaneous TID WC  . insulin aspart  0-5 Units Subcutaneous QHS  . insulin glargine  10 Units Subcutaneous QHS  . levofloxacin  500 mg Oral Q48H  . levothyroxine  100 mcg Oral QAC breakfast  . sodium bicarbonate  650 mg Oral BID    LOS: 4 days   Simran Bomkamp C 11/12/2014,2:33 PM

## 2014-11-12 NOTE — Discharge Instructions (Signed)
Follow with Primary MD in 7 days , follow your CT chest findings, you need CT chest in 4 weeks  Get CBC, CMP, 2 view Chest X ray checked  by Primary MD next visit.    Activity: As tolerated with Full fall precautions use walker/cane & assistance as needed   Disposition Home     Diet: Heart Healthy Low Carb.  For Heart failure patients - Check your Weight same time everyday, if you gain over 2 pounds, or you develop in leg swelling, experience more shortness of breath or chest pain, call your Primary MD immediately. Follow Cardiac Low Salt Diet and 1.5 lit/day fluid restriction.   On your next visit with your primary care physician please Get Medicines reviewed and adjusted.   Please request your Prim.MD to go over all Hospital Tests and Procedure/Radiological results at the follow up, please get all Hospital records sent to your Prim MD by signing hospital release before you go home.   If you experience worsening of your admission symptoms, develop shortness of breath, life threatening emergency, suicidal or homicidal thoughts you must seek medical attention immediately by calling 911 or calling your MD immediately  if symptoms less severe.  You Must read complete instructions/literature along with all the possible adverse reactions/side effects for all the Medicines you take and that have been prescribed to you. Take any new Medicines after you have completely understood and accpet all the possible adverse reactions/side effects.   Do not drive, operating heavy machinery, perform activities at heights, swimming or participation in water activities or provide baby sitting services if your were admitted for syncope or siezures until you have seen by Primary MD or a Neurologist and advised to do so again.  Do not drive when taking Pain medications.    Do not take more than prescribed Pain, Sleep and Anxiety Medications  Special Instructions: If you have smoked or chewed Tobacco  in the  last 2 yrs please stop smoking, stop any regular Alcohol  and or any Recreational drug use.  Wear Seat belts while driving.   Please note  You were cared for by a hospitalist during your hospital stay. If you have any questions about your discharge medications or the care you received while you were in the hospital after you are discharged, you can call the unit and asked to speak with the hospitalist on call if the hospitalist that took care of you is not available. Once you are discharged, your primary care physician will handle any further medical issues. Please note that NO REFILLS for any discharge medications will be authorized once you are discharged, as it is imperative that you return to your primary care physician (or establish a relationship with a primary care physician if you do not have one) for your aftercare needs so that they can reassess your need for medications and monitor your lab values.

## 2014-11-12 NOTE — Progress Notes (Signed)
Inpatient Diabetes Program Recommendations  AACE/ADA: New Consensus Statement on Inpatient Glycemic Control (2013)  Target Ranges:  Prepandial:   less than 140 mg/dL      Peak postprandial:   less than 180 mg/dL (1-2 hours)      Critically ill patients:  140 - 180 mg/dL     Results for Sheri, Pearson (MRN [id number]) as of 11/12/2014 09:05  Ref. Range 11/11/2014 07:33 11/11/2014 11:24 11/11/2014 17:09 11/11/2014 20:53  Glucose-Capillary Latest Ref Range: 65-99 mg/dL 220 (H) 248 (H) 233 (H) 169 (H)    Results for Sheri, Pearson (MRN [id number]) as of 11/12/2014 09:05  Ref. Range 11/12/2014 07:37  Glucose-Capillary Latest Ref Range: 65-99 mg/dL 219 (H)    Home DM Meds: Levemir 45 units QHS       Novolog 10 units tidwc (4-5 units with small meals)  Current DM Orders: Lantus 10 units QHS            Novolog Moderate SSI (0-15 units) TID AC + HS     MD- Please consider changing basal insulin to Levemir insulin (patient takes Levemir at home- not Lantus insulin)  Please also consider increasing basal insulin dose to Levemir 15 units QHS (this would be 1/3 total home dose)    Will follow [name] RN, MSN, CDE Diabetes Coordinator Inpatient Glycemic Control Team Team Pager: [phone] (8a-5p)

## 2014-11-13 LAB — BASIC METABOLIC PANEL
ANION GAP: 4 — AB (ref 5–15)
BUN: 45 mg/dL — ABNORMAL HIGH (ref 6–20)
CALCIUM: 7.7 mg/dL — AB (ref 8.9–10.3)
CHLORIDE: 111 mmol/L (ref 101–111)
CO2: 24 mmol/L (ref 22–32)
Creatinine, Ser: 4.32 mg/dL — ABNORMAL HIGH (ref 0.44–1.00)
GFR calc Af Amer: 11 mL/min — ABNORMAL LOW (ref >60)
GFR, EST NON AFRICAN AMERICAN: 10 mL/min — AB (ref >60)
Glucose, Bld: 136 mg/dL — ABNORMAL HIGH (ref 65–99)
Potassium: 4.3 mmol/L (ref 3.5–5.1)
SODIUM: 139 mmol/L (ref 135–145)

## 2014-11-13 LAB — GLUCOSE, CAPILLARY
GLUCOSE-CAPILLARY: 187 mg/dL — AB (ref 65–99)
Glucose-Capillary: 132 mg/dL — ABNORMAL HIGH (ref 65–99)

## 2014-11-13 MED ORDER — LEVOFLOXACIN 500 MG PO TABS: 500.0000 mg | ORAL_TABLET | ORAL | Status: DC

## 2014-11-13 MED ORDER — SODIUM BICARBONATE 650 MG PO TABS
650.0000 mg | ORAL_TABLET | Freq: Two times a day (BID) | ORAL | Status: DC
Start: 2014-11-13 — End: 2014-11-13

## 2014-11-13 NOTE — Progress Notes (Signed)
DIscharged summaries and papers explained to patient and to her sister in law([place]).Questions answered to the patient and her sistersatisfaction.

## 2014-11-13 NOTE — Progress Notes (Signed)
Assessment:  1 AKI .Marland Kitchen... improving renal function suggesting ATN, recovery 2 Pulmonary infiltrates ? 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 Yue Glasheen C

## 2014-11-13 NOTE — Discharge Summary (Signed)
Sheri Pearson, is a 69 y.o. female  DOB 12-08-1945  MRN [id number].  Admission date:  11/08/2014  Admitting Physician  Geradine Girt, DO  Discharge Date:  11/13/2014   Primary MD  Arcelia Jew, FNP  Recommendations for primary care physician for things to follow:   A review CT scan report, needs repeat CT in 4 weeks. If abnormal allergies persist appropriate workup for malignancy.  Repeat CBC, CMP, 2 view chest x-ray in a week  Outpatient renal follow-up, pulmonary if needed   Admission Diagnosis  renal failure   Discharge Diagnosis  renal failure     Active Problems:   Acute on chronic renal failure   Acute renal failure superimposed on stage 3 chronic kidney disease   Type 2 diabetes mellitus with hyperglycemia   Essential hypertension   Hyperlipidemia   PNA (pneumonia)   Pulmonary infiltrates on CXR   Diabetes type 2, uncontrolled      Past Medical History  Diagnosis Date  . Diabetes mellitus without complication   . Hypertension   . Pneumonia     History reviewed. No pertinent past surgical history.     HPI  from the history and physical done on the day of admission:     69 year old female with a history of DM 2, hyperlipidemia, and reported CKD stage III who presented to [facility] on 11/03/2014 with a 1 month history of shortness of breath and coughing that had progressively worsened over a period of 2 weeks. 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 The patient was given intravenous hydralazine and labetalol. There was concern the patient had flash pulmonary edema, so she was tx w/ IV furosemide. In addition, the patient complained of intermittent chest discomfort. Cardiology felt her  chest sx were atypical. A Lexiscan was negative. A TTE showed an EF of 60%, but no further detail is available. Her shortness of breath did not improve significantly.   A CT of the chest showed bilateral irregular round opacities. Pulmonology was consulted and they felt the working diagnosis was multifocal pneumonia versus BOOP vs malignancy. The patient was started on Vancomycin, Zosyn, and Levofloxacin on 11/06/2014. The patient never had a leukocytosis during her hospitalization, nor did she have a fever. There was consideration for bronchoscopy, but the patient wanted another opinion. In addition, the patient developed worsening renal function. Nephrology felt the renal failure was due to her hypertensive urgency, furosemide, and a component of ATN. The patient's crt was 6.31 on the day of transfer to [facility]. CPK was 153, urinalysis showed 0-2 WBCs, 300 protein. Her original admission serum creatinine was 2.20 but it was noted that her creatinine was around 1.4 in November 2012. Because of the patient's worsening renal failure and unclear etiology of her pulmonary opacities, she was transferred to [facility] Course:     Acute on chronic renal failure stage 3 Likely multifactorial to include malignant HTN,  volume depletion, possible vasculitis, ARB and NSAID (daily ibuprofen) use - FeNa 1.67% - stopped lasix and ARB renal function has considerably improved with gentle hydration and stoppage of offending medications. Seen by renal. Discussed with Dr. Florene Glen stable for discharge. Request PCP to monitor BMP and ensure outpatient renal follow-up closely. Initially required bicarbonate which will be discontinued upon discharge as levels are stable.    Pulmonary opacities - Fever to 102.5 - Atypical PNA of unclear etiology  patchy bilateral nodular airspace process noted on CT chest - CT chest here compared to CT chest from [facility] (CD found) suggesting no significant change -  recurrence of fever as high as 102.5 strongly suggestive of infectious process - was on IV vancomycin and Zosyn now transitioned to Levaquin on 11/12/2014 clinically much improved and symptom free, no oxygen demand, cultures negative. Will be placed on Levaquin for a few more days and discharged.   Due to unusual CT findings as below will request repeat CT in 4 weeks if abnormalities persist appropriate workup if needed for malignancy.   HTN BP currently well controlled, Lasix and ARB on hold upon discharge. Continue to monitor.   Hypothyroidism Continue home Synthroid dose   HLD - no change in home regimen   Abdom pain  CT abdom w/o acute findings - sx resolved    Obesity - Body mass index is 44.93 kg/(m^2). Follow with PCP.   Diabetes mellitus type 2  CBG currently reasonably controlled - A1c was 7.5 will commence home regimen, request PCP to monitor CBGs and A1c and adjust medications as needed        Discharge Condition: Stable  Follow UP  Follow-up Information    Follow up with [facility], MD. Schedule an appointment as soon as possible for a visit in 1 week.   Specialty:  Nephrology   Why:  ARF   Contact information:   [place] [id number] [phone]       Follow up with Your PCP. Schedule an appointment as soon as possible for a visit in 1 week.   Why:  follow CT results      Follow up with [facility], MD. Schedule an appointment as soon as possible for a visit in 1 week.   Specialty:  Pulmonary Disease   Why:  Lung infilterates on CT   Contact information:   [place]  [phone]       Follow up with Arcelia Jew, [place]. Schedule an appointment as soon as possible for a visit in 1 week.   Specialty:  Family Medicine   Why:  get CT scan reviewd and discharge summary reviewed thoroughly   Contact information:   # [address] [place] [address] [phone]          Consults obtained - renal  Diet and Activity recommendation: See Discharge Instructions below  Discharge Instructions           Discharge Instructions    Discharge instructions    Complete by:  As directed   Follow with Primary MD in 7 days , follow your CT chest findings, you need CT chest in 4 weeks  Get CBC, CMP, 2 view Chest X ray checked  by Primary MD next visit.    Activity: As tolerated with Full fall precautions use walker/cane &  assistance as needed   Disposition Home     Diet: Heart Healthy Low Carb.  For Heart failure patients - Check your Weight same time everyday, if you gain over 2 pounds, or you develop in leg swelling, experience more shortness of breath or chest pain, call your Primary MD immediately. Follow Cardiac Low Salt Diet and 1.5 lit/day fluid restriction.   On your next visit with your primary care physician please Get Medicines reviewed and adjusted.   Please request your Prim.MD to go over all Hospital Tests and Procedure/Radiological results at the follow up, please get all Hospital records sent to your Prim MD by signing hospital release before you go home.   If you experience worsening of your admission symptoms, develop shortness of breath, life threatening emergency, suicidal or homicidal thoughts you must seek medical attention immediately by calling 911 or calling your MD immediately  if symptoms less severe.  You Must read complete instructions/literature along with all the possible adverse reactions/side effects for all the Medicines you take and that have been prescribed to you. Take any new Medicines after you have completely understood and accpet all the possible adverse reactions/side effects.   Do not drive, operating heavy machinery, perform activities at heights, swimming or participation in water activities or provide baby sitting services if your were admitted for syncope or siezures until you have seen by Primary MD or a  Neurologist and advised to do so again.  Do not drive when taking Pain medications.    Do not take more than prescribed Pain, Sleep and Anxiety Medications  Special Instructions: If you have smoked or chewed Tobacco  in the last 2 yrs please stop smoking, stop any regular Alcohol  and or any Recreational drug use.  Wear Seat belts while driving.   Please note  You were cared for by a hospitalist during your hospital stay. If you have any questions about your discharge medications or the care you received while you were in the hospital after you are discharged, you can call the unit and asked to speak with the hospitalist on call if the hospitalist that took care of you is not available. Once you are discharged, your primary care physician will handle any further medical issues. Please note that NO REFILLS for any discharge medications will be authorized once you are discharged, as it is imperative that you return to your primary care physician (or establish a relationship with a primary care physician if you do not have one) for your aftercare needs so that they can reassess your need for medications and monitor your lab values.     Increase activity slowly    Complete by:  As directed              Discharge Medications       Medication List    STOP taking these medications        furosemide 20 MG tablet  Commonly known as:  LASIX     MICARDIS 80 MG tablet  Generic drug:  telmisartan      TAKE these medications        acetaminophen 650 MG CR tablet  Commonly known as:  TYLENOL  Take 650 mg by mouth every 8 (eight) hours as needed for pain.     aspirin EC 81 MG tablet  Take 81 mg by mouth at bedtime.     carbamide peroxide 6.5 % otic solution  Commonly known as:  DEBROX  Place 1 drop into  both ears daily as needed (ear wax).     carvedilol 12.5 MG tablet  Commonly known as:  COREG  Take 25 mg by mouth 2 (two) times daily.     docusate sodium 100 MG capsule    Commonly known as:  COLACE  Take 100 mg by mouth daily.     insulin aspart 100 UNIT/ML injection  Commonly known as:  novoLOG  Inject 10 Units into the skin 3 (three) times daily before meals. 4-5 units if meal is small.     insulin detemir 100 UNIT/ML injection  Commonly known as:  LEVEMIR  Inject 45 Units into the skin at bedtime.     levofloxacin 500 MG tablet  Commonly known as:  LEVAQUIN  Take 1 tablet (500 mg total) by mouth every other day.     levothyroxine 100 MCG tablet  Commonly known as:  SYNTHROID, LEVOTHROID  Take 100 mcg by mouth at bedtime.     meclizine 25 MG tablet  Commonly known as:  ANTIVERT  Take 25 mg by mouth 3 (three) times daily as needed for dizziness.     rosuvastatin 10 MG tablet  Commonly known as:  CRESTOR  Take 10 mg by mouth at bedtime.     verapamil 120 MG tablet  Commonly known as:  CALAN  Take 120 mg by mouth 2 (two) times daily.        Major procedures and Radiology Reports - PLEASE review detailed and final reports for all details, in brief -       Ct Abdomen Pelvis Wo Contrast  11/09/2014   CLINICAL DATA:  Acute and chronic renal failure. Pulmonary infiltrates on chest x-ray. Patient reports nausea and vomiting 3-4 days. Shortness of breath 1 month. Worsening cough past 2 weeks.  EXAM: CT CHEST, ABDOMEN AND PELVIS WITHOUT CONTRAST  TECHNIQUE: Multidetector CT imaging of the chest, abdomen and pelvis was performed following the standard protocol without IV contrast.  COMPARISON:  None.  FINDINGS: CT CHEST FINDINGS  Lungs are adequately inflated and demonstrate a moderate bilateral patchy nodular airspace process worse over the mid lungs. Tiny amount of left pleural fluid is present. Airways are normal.  Mild cardiomegaly. No definite mediastinal, hilar or axillary adenopathy. Minimal calcified plaque over the thoracic aorta.  There mild degenerate changes of the spine. There is a well-defined oval lucency over the right side of the  sternal manubrium.  CT ABDOMEN AND PELVIS FINDINGS  The liver, spleen, pancreas, gallbladder and left adrenal gland are within normal on this noncontrast exam. Minimal prominence of the right adrenal gland. Kidneys are normal in size as there are several small right-sided renal stones mostly within the lower pole collecting system. No evidence of hydronephrosis. Ureters are within normal. Appendix is normal. There is very minimal calcified plaque over the abdominal aorta. No focal inflammatory change or free fluid noted. No evidence of adenopathy.  Pelvic images demonstrate the bladder and rectum to be within normal. Uterus and left ovary are within normal as the right ovary is not well visualized. Mild degenerate changes of the spine.  IMPRESSION: Significant patchy bilateral nodular airspace process with small amount of left pleural fluid. No adenopathy. Findings likely due to an infectious process and less likely inflammatory process or metastatic disease. Recommend followup CT in 4 weeks.  Right-sided nephrolithiasis.  Mild cardiomegaly.  Single well-defined low-density 1 cm lesion over the right-sided the sternal manubrium. Likely a benign process such as a hemangioma and less likely metastatic disease.  Electronically Signed   By: Marin Olp M.D.   On: 11/09/2014 07:40   Dg Chest 2 View  11/10/2014   CLINICAL DATA:  Renal failure, pulmonary infiltrates and nodular airspace disease.  EXAM: CHEST - 2 VIEW  COMPARISON:  CT of the chest on 11/08/2014  FINDINGS: Patchy areas of bilateral pulmonary airspace disease remain present. Compared to the prior chest CT, this process is likely improving and findings are consistent with bilateral infection versus atypical appearance of pulmonary edema. The heart size is at the upper limits of normal. No pleural effusions are identified. Diffuse spondylosis present of the thoracic spine.  IMPRESSION: Likely improving nodular airspace disease in both lungs compared to CT  appearance 2 days ago. Findings are consistent with bilateral pneumonia versus atypical appearance of pulmonary edema.   Electronically Signed   By: Aletta Edouard M.D.   On: 11/10/2014 18:19   Ct Chest Wo Contrast  11/09/2014   CLINICAL DATA:  Acute and chronic renal failure. Pulmonary infiltrates on chest x-ray. Patient reports nausea and vomiting 3-4 days. Shortness of breath 1 month. Worsening cough past 2 weeks.  EXAM: CT CHEST, ABDOMEN AND PELVIS WITHOUT CONTRAST  TECHNIQUE: Multidetector CT imaging of the chest, abdomen and pelvis was performed following the standard protocol without IV contrast.  COMPARISON:  None.  FINDINGS: CT CHEST FINDINGS  Lungs are adequately inflated and demonstrate a moderate bilateral patchy nodular airspace process worse over the mid lungs. Tiny amount of left pleural fluid is present. Airways are normal.  Mild cardiomegaly. No definite mediastinal, hilar or axillary adenopathy. Minimal calcified plaque over the thoracic aorta.  There mild degenerate changes of the spine. There is a well-defined oval lucency over the right side of the sternal manubrium.  CT ABDOMEN AND PELVIS FINDINGS  The liver, spleen, pancreas, gallbladder and left adrenal gland are within normal on this noncontrast exam. Minimal prominence of the right adrenal gland. Kidneys are normal in size as there are several small right-sided renal stones mostly within the lower pole collecting system. No evidence of hydronephrosis. Ureters are within normal. Appendix is normal. There is very minimal calcified plaque over the abdominal aorta. No focal inflammatory change or free fluid noted. No evidence of adenopathy.  Pelvic images demonstrate the bladder and rectum to be within normal. Uterus and left ovary are within normal as the right ovary is not well visualized. Mild degenerate changes of the spine.  IMPRESSION: Significant patchy bilateral nodular airspace process with small amount of left pleural fluid. No  adenopathy. Findings likely due to an infectious process and less likely inflammatory process or metastatic disease. Recommend followup CT in 4 weeks.  Right-sided nephrolithiasis.  Mild cardiomegaly.  Single well-defined low-density 1 cm lesion over the right-sided the sternal manubrium. Likely a benign process such as a hemangioma and less likely metastatic disease.   Electronically Signed   By: Marin Olp M.D.   On: 11/09/2014 07:40   US Renal  11/09/2014   CLINICAL DATA:  Acute on chronic renal failure.  EXAM: RENAL / URINARY TRACT ULTRASOUND COMPLETE  COMPARISON:  None.  FINDINGS: Right Kidney:  Length: 13.6 cm. Echogenicity within normal limits. No mass or hydronephrosis visualized. 8 mm echogenic focus over the lower pole collecting system which shadowing suggesting a stone.  Left Kidney:  Length: 13.2 cm. Echogenicity within normal limits. No mass or hydronephrosis visualized.  Bladder:  Appears normal for degree of bladder distention.  IMPRESSION: Normal size kidneys without evidence of hydronephrosis. Suggestion of  an 8 mm stone over the lower pole collecting system of the right kidney.   Electronically Signed   By: Marin Olp M.D.   On: 11/09/2014 12:57    Micro Results      Recent Results (from the past 240 hour(s))  MRSA PCR Screening     Status: None   Collection Time: 11/08/14  2:00 PM  Result Value Ref Range Status   MRSA by PCR NEGATIVE NEGATIVE Final    Comment:        The GeneXpert MRSA Assay (FDA approved for NASAL specimens only), is one component of a comprehensive MRSA colonization surveillance program. It is not intended to diagnose MRSA infection nor to guide or monitor treatment for MRSA infections.   Culture, blood (routine x 2)     Status: None (Preliminary result)   Collection Time: 11/09/14  4:55 PM  Result Value Ref Range Status   Specimen Description BLOOD RIGHT HAND  Final   Special Requests BOTTLES DRAWN AEROBIC AND ANAEROBIC 10CC  Final    Culture NO GROWTH 3 DAYS  Final   Report Status PENDING  Incomplete  Culture, blood (routine x 2)     Status: None (Preliminary result)   Collection Time: 11/09/14  5:05 PM  Result Value Ref Range Status   Specimen Description BLOOD LEFT HAND  Final   Special Requests BOTTLES DRAWN AEROBIC AND ANAEROBIC 10CC  Final   Culture NO GROWTH 3 DAYS  Final   Report Status PENDING  Incomplete  Clostridium Difficile by PCR (not at [facility])     Status: None   Collection Time: 11/10/14  8:50 AM  Result Value Ref Range Status   C difficile by pcr NEGATIVE NEGATIVE Final       Today   Subjective    Sukaina Toothaker today has no headache,no chest abdominal pain,no new weakness tingling or numbness, feels much better wants to go home today.    Objective   Blood pressure 146/72, pulse 73, temperature 98.3 F (36.8 C), temperature source Oral, resp. rate 19, height 5\' 6"  (1.676 m), weight 130.1 kg (286 lb 13.1 oz), SpO2 95 %.   Intake/Output Summary (Last 24 hours) at 11/13/14 0910 Last data filed at 11/13/14 0500  Gross per 24 hour  Intake   1320 ml  Output    653 ml  Net    667 ml    Exam Awake Alert, Oriented x 3, No new F.N deficits, Normal affect [place].AT,PERRAL Supple Neck,No JVD, No cervical lymphadenopathy appriciated.  Symmetrical Chest wall movement, Good air movement bilaterally, CTAB RRR,No Gallops,Rubs or new Murmurs, No Parasternal Heave +ve B.Sounds, Abd Soft, Non tender, No organomegaly appriciated, No rebound -guarding or rigidity. No Cyanosis, Clubbing or edema, No new Rash or bruise   Data Review   CBC w Diff:  Lab Results  Component Value Date   WBC 8.5 11/12/2014   HGB 8.2* 11/12/2014   HCT 26.0* 11/12/2014   PLT 332 11/12/2014   LYMPHOPCT 16 11/12/2014   MONOPCT 8 11/12/2014   EOSPCT 4 11/12/2014   BASOPCT 1 11/12/2014    CMP:  Lab Results  Component Value Date   NA 139 11/13/2014   K 4.3 11/13/2014   CL 111 11/13/2014   CO2 24 11/13/2014   BUN 45*  11/13/2014   CREATININE 4.32* 11/13/2014   PROT 8.2* 11/12/2014   ALBUMIN 1.9* 11/12/2014   BILITOT 0.3 11/12/2014   ALKPHOS 33* 11/12/2014   AST 22 11/12/2014   ALT  22 11/12/2014  .   Total Time in preparing paper work, data evaluation and todays exam - 35 minutes  Thurnell Lose M.D on 11/13/2014 at 9:10 AM  [facility]   Office  [phone]

## 2014-11-13 NOTE — Care Management Note (Signed)
Case Management Note  Patient Details  Name: Sheri Pearson MRN: [id number] Date of Birth: Dec 13, 1945  Subjective/Objective:         CM following for progression and d/c planning.           Action/Plan: Not d/c needs identified.   Expected Discharge Date:       11/13/2014           Expected Discharge Plan:  Home/Self Care  In-House Referral:  NA  Discharge planning Services  NA  Post Acute Care Choice:  NA Choice offered to:  NA  DME Arranged:    DME Agency:     HH Arranged:    HH Agency:     Status of Service:  Completed, signed off  Medicare Important Message Given:  Yes-second notification given Date Medicare IM Given:    Medicare IM give by:    Date Additional Medicare IM Given:    Additional Medicare Important Message give by:     If discussed at [place] of Stay Meetings, dates discussed:    Additional Comments:  Adron Bene, RN 11/13/2014, 11:38 AM

## 2014-11-14 LAB — CULTURE, BLOOD (ROUTINE X 2)
Culture: NO GROWTH
Culture: NO GROWTH

## 2014-11-25 ENCOUNTER — Encounter: Payer: Self-pay | Admitting: Internal Medicine

## 2014-11-25 ENCOUNTER — Ambulatory Visit (INDEPENDENT_AMBULATORY_CARE_PROVIDER_SITE_OTHER): Payer: Self-pay | Admitting: Internal Medicine

## 2014-11-25 ENCOUNTER — Ambulatory Visit (INDEPENDENT_AMBULATORY_CARE_PROVIDER_SITE_OTHER)
Admission: RE | Admit: 2014-11-25 | Discharge: 2014-11-25 | Disposition: A | Discharge status: Home / Self Care | Payer: Medicare Other | Source: Ambulatory Visit | Attending: Internal Medicine | Admitting: Internal Medicine

## 2014-11-25 VITALS — BP 134/80 | HR 73 | Wt 277.4 lb

## 2014-11-25 DIAGNOSIS — R918 Other nonspecific abnormal finding of lung field: Secondary | ICD-10-CM

## 2014-11-25 DIAGNOSIS — J984 Other disorders of lung: Secondary | ICD-10-CM | POA: Diagnosis not present

## 2014-11-25 NOTE — Patient Instructions (Signed)
Please remember to go to the x-ray department downstairs for your tests - we will call you with the results when they are available.  Pulmonary follow up as needed

## 2014-11-25 NOTE — Progress Notes (Signed)
Subjective:     Patient ID: Sheri Pearson, female   DOB: 1945-09-10,  MRN: [id number]  HPI  78 yobf never smoker with new onset rattling cough / clear mucus with some blood    Admission date: 11/08/2014 Discharge Date: 11/13/2014    Discharge Diagnosis renal failure   Active Problems:  Acute on chronic renal failure  Acute renal failure superimposed on stage 3 chronic kidney disease  Type 2 diabetes mellitus with hyperglycemia  Essential hypertension  Hyperlipidemia  PNA (pneumonia)  Pulmonary infiltrates on CXR  Diabetes type 2, uncontrolled    Past Medical History  Diagnosis Date  . Diabetes mellitus without complication   . Hypertension   . Pneumonia     History reviewed. No pertinent past surgical history.    HPI from the history and physical done on the day of admission:    69 year old female with a history of DM 2, hyperlipidemia, and reported CKD stage III who presented to [facility] on 11/03/2014 with a 1 month history of shortness of breath and coughing that had progressively worsened over a period of 2 weeks. 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 The patient was given intravenous hydralazine and labetalol. There was concern the patient had flash pulmonary edema, so she was tx w/ IV furosemide. In addition, the patient complained of intermittent chest discomfort. Cardiology felt her chest sx were atypical. A Lexiscan was negative. A TTE showed an EF of 60%, but no further detail is available. Her shortness of breath did not improve significantly.   A CT of the chest showed bilateral irregular round opacities. Pulmonology was consulted and they felt the working diagnosis was multifocal pneumonia versus BOOP vs malignancy. The patient was started on Vancomycin, Zosyn, and Levofloxacin on 11/06/2014. The patient never had a  leukocytosis during her hospitalization, nor did she have a fever. There was consideration for bronchoscopy, but the patient wanted another opinion. In addition, the patient developed worsening renal function. Nephrology felt the renal failure was due to her hypertensive urgency, furosemide, and a component of ATN. The patient's crt was 6.31 on the day of transfer to [facility]. CPK was 153, urinalysis showed 0-2 WBCs, 300 protein. Her original admission serum creatinine was 2.20 but it was noted that her creatinine was around 1.4 in November 2012. Because of the patient's worsening renal failure and unclear etiology of her pulmonary opacities, she was transferred to [facility] Course:    Acute on chronic renal failure stage 3 Likely multifactorial to include malignant HTN, volume depletion, possible vasculitis, ARB and NSAID (daily ibuprofen) use - FeNa 1.67% - stopped lasix and ARB renal function has considerably improved with gentle hydration and stoppage of offending medications. Seen by renal. Discussed with Dr. Florene Glen stable for discharge. Request PCP to monitor BMP and ensure outpatient renal follow-up closely. Initially required bicarbonate which will be discontinued upon discharge as levels are stable.    Pulmonary opacities - Fever to 102.5 - Atypical PNA of unclear etiology  patchy bilateral nodular airspace process noted on CT chest - CT chest here compared to CT chest from [facility] (CD found) suggesting no significant change - recurrence of fever as high as 102.5 strongly suggestive of infectious process - was on IV vancomycin and Zosyn now transitioned to Levaquin on 11/12/2014 clinically much improved and symptom free, no oxygen demand, cultures negative. Will be placed on Levaquin for a  few more days and discharged.   Due to unusual CT findings as below will request repeat CT in 4 weeks if abnormalities persist appropriate workup if needed for  malignancy.   HTN BP currently well controlled, Lasix and ARB on hold upon discharge. Continue to monitor.   Hypothyroidism Continue home Synthroid dose   HLD - no change in home regimen   Abdom pain  CT abdom w/o acute findings - sx resolved    Obesity - Body mass index is 44.93 kg/(m^2). Follow with PCP.   Diabetes mellitus type 2  CBG currently reasonably controlled - A1c was 7.5 will commence home regimen, request PCP to monitor CBGs and A1c and adjust medications as needed       11/25/2014 consult [name] re: pulmonary infiltrates  Chief Complaint  Patient presents with  . Hospitalization Follow-up    Pt discharged from hospital 11/13/14. Pt states feeling better. States cough and SOB have resolved    Can now  walk [place] at nl pace no 02 and min dry cough day > noct   Able to lie down flat at hs / no resp meds   No obvious day to day or daytime variability orh/o excess/ purulent sputum (did have hemoptysis once early on in illness)  or cp or chest tightness, subjective wheeze or overt sinus or hb symptoms. No unusual exp hx or h/o childhood pna/ asthma or knowledge of premature birth.  Sleeping ok without nocturnal  or early am exacerbation  of respiratory  c/o's or need for noct saba. Also denies any obvious fluctuation of symptoms with weather or environmental changes or other aggravating or alleviating factors except as outlined above   Current Medications, Allergies, Complete Past Medical History, Past Surgical History, Family History, and Social History were reviewed in [employer] record.  ROS  The following are not active complaints unless bolded sore throat, dysphagia, dental problems, itching, sneezing,  nasal congestion or excess/ purulent secretions, ear ache,   fever, chills, sweats, unintended wt loss, classically pleuritic or exertional cp, hemoptysis,  orthopnea pnd or leg swelling, presyncope, palpitations, abdominal pain,  anorexia, nausea, vomiting, diarrhea  or change in bowel or bladder habits, change in stools or urine, dysuria,hematuria,  rash, arthralgias, visual complaints, headache, numbness, weakness or ataxia or problems with walking or coordination,  change in mood/affect or memory.        Review of Systems     Objective:   Physical Exam    amb bf nad   Wt Readings from Last 3 Encounters:  11/25/14 277 lb 6.4 oz (125.828 kg)  11/12/14 286 lb 13.1 oz (130.1 kg)    Vital signs reviewed   HEENT: one tooth remaining/ full upper dentures,  nl turbinates, and orophanx. Nl external ear canals without cough reflex   NECK :  without JVD/Nodes/TM/ nl carotid upstrokes bilaterally   LUNGS: no acc muscle use, clear to A and P bilaterally without cough on insp or exp maneuvers   CV:  RRR  no s3 or murmur or increase in P2, no edema   ABD:  soft and nontender with nl excursion in the supine position. No bruits or organomegaly, bowel sounds nl  MS:  warm without deformities, calf tenderness, cyanosis or clubbing  SKIN: warm and dry without lesions    NEURO:  alert, approp, no deficits     CXR PA and Lateral:   11/25/2014 :     I personally reviewed images and agree with  radiology impression as follows:    No significant change in residual bilateral patchy pulmonary airspace disease. Follow-up CT of the chest may be helpful to compared to the prior CT study     Lab Results  Component Value Date   ESRSEDRATE 137* 11/10/2014    Assessment:

## 2014-11-26 ENCOUNTER — Encounter: Payer: Self-pay | Admitting: Internal Medicine

## 2014-11-26 DIAGNOSIS — N179 Acute kidney failure, unspecified: Secondary | ICD-10-CM | POA: Diagnosis not present

## 2014-11-26 DIAGNOSIS — E1129 Type 2 diabetes mellitus with other diabetic kidney complication: Secondary | ICD-10-CM | POA: Diagnosis not present

## 2014-11-26 DIAGNOSIS — I10 Essential (primary) hypertension: Secondary | ICD-10-CM | POA: Diagnosis not present

## 2014-11-26 DIAGNOSIS — N183 Chronic kidney disease, stage 3 (moderate): Secondary | ICD-10-CM | POA: Diagnosis not present

## 2014-11-26 NOTE — Assessment & Plan Note (Signed)
Body mass index is 44.79 kg/(m^2).  Lab Results  Component Value Date   TSH 4.208 11/08/2014     Contributing to hbp/ dm/ chronic doe/ needs to achieve and maintain neg calorie balance >  f/u primary care

## 2014-11-26 NOTE — Progress Notes (Signed)
Quick Note:  lmtcb ______ 

## 2014-11-26 NOTE — Assessment & Plan Note (Addendum)
Onset of symptoms in May 2016  - ESR 137 11/10/14  ddx for diffuse pulmonary infiltrates:  Miscellaneous:Alv microlithiasis, alv proteinosis, asp, bronchiectais, BOOP   ARDS/ ALI/ AIP Occupational dz/ HSP Neoplasm Infection Drug  Pulmonary emboli, Septic emboli  Protein disorders Edema/Eosinophilic dz Sarcoidosis Hist X / Hemorrhage Idiopathic  Vasculitis   She appears so much better my bet is this represents some form of acute lung injury perhaps with fx capillaries from transient very high filling pressures re lated to malignant hypertension as she is so much better now that her bp is down to normal range.  Nodular boop can look like this too and doesn't always need steroids to resolve.   Will definitely need follow up with cxr and ESR on return in 2 weeks  Total time = 38m review case with pt/ discussion/ counseling/ giving and going over instructions (see avs)

## 2014-12-01 ENCOUNTER — Telehealth: Payer: Self-pay | Admitting: Internal Medicine

## 2014-12-01 NOTE — Telephone Encounter (Signed)
Notes Recorded by Tanda Rockers, MD on 11/26/2014 at 5:52 AM Call pt: Reviewed cxr and it is not changed much so needs f/u here with cxr in 2 weeks- call sooner if losing ground --------------------------------------  Pt aware of results/recs.  States she will follow up with her PCP about this issue, declined a rov with MW.  Nothing further needed at this time.  Forwarding to MW to make aware.

## 2014-12-01 NOTE — Telephone Encounter (Signed)
Fine with me

## 2015-06-08 ENCOUNTER — Emergency Department (HOSPITAL_COMMUNITY): Payer: Medicare Other

## 2015-06-08 ENCOUNTER — Inpatient Hospital Stay (HOSPITAL_COMMUNITY)
Admission: EM | Admit: 2015-06-08 | Discharge: 2015-06-13 | DRG: 189 | Disposition: A | Discharge status: Home / Self Care | Payer: Medicare Other | Attending: Internal Medicine | Admitting: Internal Medicine

## 2015-06-08 ENCOUNTER — Encounter (HOSPITAL_COMMUNITY): Payer: Self-pay | Admitting: *Deleted

## 2015-06-08 DIAGNOSIS — I10 Essential (primary) hypertension: Secondary | ICD-10-CM | POA: Diagnosis present

## 2015-06-08 DIAGNOSIS — I5031 Acute diastolic (congestive) heart failure: Secondary | ICD-10-CM | POA: Diagnosis not present

## 2015-06-08 DIAGNOSIS — I129 Hypertensive chronic kidney disease with stage 1 through stage 4 chronic kidney disease, or unspecified chronic kidney disease: Secondary | ICD-10-CM | POA: Diagnosis not present

## 2015-06-08 DIAGNOSIS — D631 Anemia in chronic kidney disease: Secondary | ICD-10-CM | POA: Diagnosis present

## 2015-06-08 DIAGNOSIS — Z794 Long term (current) use of insulin: Secondary | ICD-10-CM

## 2015-06-08 DIAGNOSIS — R918 Other nonspecific abnormal finding of lung field: Secondary | ICD-10-CM | POA: Diagnosis present

## 2015-06-08 DIAGNOSIS — Z79899 Other long term (current) drug therapy: Secondary | ICD-10-CM

## 2015-06-08 DIAGNOSIS — D649 Anemia, unspecified: Secondary | ICD-10-CM | POA: Diagnosis not present

## 2015-06-08 DIAGNOSIS — N189 Chronic kidney disease, unspecified: Secondary | ICD-10-CM | POA: Diagnosis not present

## 2015-06-08 DIAGNOSIS — R0602 Shortness of breath: Secondary | ICD-10-CM | POA: Diagnosis not present

## 2015-06-08 DIAGNOSIS — N184 Chronic kidney disease, stage 4 (severe): Secondary | ICD-10-CM | POA: Diagnosis not present

## 2015-06-08 DIAGNOSIS — J810 Acute pulmonary edema: Secondary | ICD-10-CM | POA: Diagnosis not present

## 2015-06-08 DIAGNOSIS — R531 Weakness: Secondary | ICD-10-CM | POA: Diagnosis not present

## 2015-06-08 DIAGNOSIS — R911 Solitary pulmonary nodule: Secondary | ICD-10-CM

## 2015-06-08 DIAGNOSIS — E1129 Type 2 diabetes mellitus with other diabetic kidney complication: Secondary | ICD-10-CM | POA: Diagnosis present

## 2015-06-08 DIAGNOSIS — E1122 Type 2 diabetes mellitus with diabetic chronic kidney disease: Secondary | ICD-10-CM | POA: Diagnosis present

## 2015-06-08 DIAGNOSIS — E785 Hyperlipidemia, unspecified: Secondary | ICD-10-CM | POA: Diagnosis present

## 2015-06-08 DIAGNOSIS — J9601 Acute respiratory failure with hypoxia: Principal | ICD-10-CM | POA: Diagnosis present

## 2015-06-08 DIAGNOSIS — Z888 Allergy status to other drugs, medicaments and biological substances status: Secondary | ICD-10-CM

## 2015-06-08 DIAGNOSIS — D638 Anemia in other chronic diseases classified elsewhere: Secondary | ICD-10-CM | POA: Diagnosis present

## 2015-06-08 DIAGNOSIS — Z9889 Other specified postprocedural states: Secondary | ICD-10-CM

## 2015-06-08 DIAGNOSIS — E11649 Type 2 diabetes mellitus with hypoglycemia without coma: Secondary | ICD-10-CM | POA: Diagnosis not present

## 2015-06-08 DIAGNOSIS — Z6841 Body Mass Index (BMI) 40.0 and over, adult: Secondary | ICD-10-CM

## 2015-06-08 DIAGNOSIS — Z7982 Long term (current) use of aspirin: Secondary | ICD-10-CM

## 2015-06-08 DIAGNOSIS — E039 Hypothyroidism, unspecified: Secondary | ICD-10-CM | POA: Diagnosis present

## 2015-06-08 DIAGNOSIS — N179 Acute kidney failure, unspecified: Secondary | ICD-10-CM | POA: Diagnosis present

## 2015-06-08 DIAGNOSIS — N185 Chronic kidney disease, stage 5: Secondary | ICD-10-CM | POA: Diagnosis present

## 2015-06-08 DIAGNOSIS — E1165 Type 2 diabetes mellitus with hyperglycemia: Secondary | ICD-10-CM

## 2015-06-08 DIAGNOSIS — R0600 Dyspnea, unspecified: Secondary | ICD-10-CM

## 2015-06-08 LAB — CBC WITH DIFFERENTIAL/PLATELET
BASOS PCT: 0 %
Basophils Absolute: 0 10*3/uL (ref 0.0–0.1)
Eosinophils Absolute: 0.2 10*3/uL (ref 0.0–0.7)
Eosinophils Relative: 3 %
HEMATOCRIT: 24.4 % — AB (ref 36.0–46.0)
Hemoglobin: 7.1 g/dL — ABNORMAL LOW (ref 12.0–15.0)
Lymphocytes Relative: 22 %
Lymphs Abs: 1.6 10*3/uL (ref 0.7–4.0)
MCH: 25.5 pg — ABNORMAL LOW (ref 26.0–34.0)
MCHC: 29.1 g/dL — AB (ref 30.0–36.0)
MCV: 87.8 fL (ref 78.0–100.0)
MONO ABS: 0.5 10*3/uL (ref 0.1–1.0)
MONOS PCT: 7 %
NEUTROS ABS: 4.9 10*3/uL (ref 1.7–7.7)
Neutrophils Relative %: 68 %
PLATELETS: 344 10*3/uL (ref 150–400)
RBC: 2.78 MIL/uL — ABNORMAL LOW (ref 3.87–5.11)
RDW: 16.5 % — ABNORMAL HIGH (ref 11.5–15.5)
WBC: 7.2 10*3/uL (ref 4.0–10.5)

## 2015-06-08 LAB — COMPREHENSIVE METABOLIC PANEL
ALBUMIN: 2.9 g/dL — AB (ref 3.5–5.0)
ALK PHOS: 51 U/L (ref 38–126)
ALT: 11 U/L — AB (ref 14–54)
AST: 16 U/L (ref 15–41)
Anion gap: 7 (ref 5–15)
BILIRUBIN TOTAL: 0.4 mg/dL (ref 0.3–1.2)
BUN: 40 mg/dL — ABNORMAL HIGH (ref 6–20)
CO2: 24 mmol/L (ref 22–32)
CREATININE: 2.78 mg/dL — AB (ref 0.44–1.00)
Calcium: 9 mg/dL (ref 8.9–10.3)
Chloride: 109 mmol/L (ref 101–111)
GFR calc Af Amer: 19 mL/min — ABNORMAL LOW (ref >60)
GFR calc non Af Amer: 17 mL/min — ABNORMAL LOW (ref >60)
GLUCOSE: 117 mg/dL — AB (ref 65–99)
Potassium: 4.9 mmol/L (ref 3.5–5.1)
SODIUM: 140 mmol/L (ref 135–145)
Total Protein: 9.9 g/dL — ABNORMAL HIGH (ref 6.5–8.1)

## 2015-06-08 LAB — TYPE AND SCREEN
ABO/RH(D): A NEG
ANTIBODY SCREEN: NEGATIVE

## 2015-06-08 LAB — I-STAT TROPONIN, ED: Troponin i, poc: 0 ng/mL (ref 0.00–0.08)

## 2015-06-08 LAB — ABO/RH: ABO/RH(D): A NEG

## 2015-06-08 LAB — BRAIN NATRIURETIC PEPTIDE: B Natriuretic Peptide: 154.3 pg/mL — ABNORMAL HIGH (ref 0.0–100.0)

## 2015-06-08 LAB — I-STAT CG4 LACTIC ACID, ED: LACTIC ACID, VENOUS: 0.42 mmol/L — AB (ref 0.5–2.0)

## 2015-06-08 NOTE — ED Notes (Signed)
Pt c/o shortness of breath for a couple of weeks. Pt reports dyspnea with exertion and unable to lay flat. Pt states her kidneys are going bad so she retains a lot of fluid. Pt was recently seen in [place] for similar symptoms today but left because she didn't like their services. Pt denies any pain at this time.

## 2015-06-08 NOTE — ED Provider Notes (Signed)
CSN: [id number]     Arrival date & time 06/08/15  1930 History   First MD Initiated Contact with Patient 06/08/15 2124     Chief Complaint  Patient presents with  . Shortness of Breath      HPI  She presents evaluation of shortness of breath. Describes symptoms worsening over the last 2 weeks, and more acutely worse for the last 2 days. States she feels more short of breath lying supine and cannot sleep flat. She feels weak with any exertion around the house. No chest pain. No change in the amount of peripheral edema. Seen and evaluated [facility] emergency room today. Had an x-ray and was told she may be in congestive heart failure was given IV Lasix. However she left emergency room AMA and she wanted to come here because "my kidney doctor is here".  Almost exact similar presentation June 2016 starting in [place]. She was heavily diuresed and transferred here." Markedly renal insufficient and anemic. Did recover her kidney function back to her baseline of around 2.5. Required blood transfusion. Had chest x-ray and CT scan showing multiple nodules and there is inflammation in her chest favored to be infection. Placed on antibiotics. Follow-up CT was recommended. PERRLA did not ever undergo this. Did see pulmonary and follow-up 1 week later after discharge.  Past Medical History  Diagnosis Date  . Diabetes mellitus without complication ([place])   . Hypertension   . Pneumonia    History reviewed. No pertinent past surgical history. Family History  Problem Relation Age of Onset  . Heart disease Father   . Heart disease Mother   . Heart disease Brother    Social History  Substance Use Topics  . Smoking status: Never Smoker   . Smokeless tobacco: None  . Alcohol Use: None   OB History    No data available     Review of Systems  Constitutional: Negative for fever, chills, diaphoresis, appetite change and fatigue.  HENT: Negative for mouth sores, sore throat and trouble swallowing.    Eyes: Negative for visual disturbance.  Respiratory: Positive for cough and shortness of breath. Negative for chest tightness and wheezing.        Orthopnea  Cardiovascular: Negative for chest pain.  Gastrointestinal: Negative for nausea, vomiting, abdominal pain, diarrhea and abdominal distention.  Endocrine: Negative for polydipsia, polyphagia and polyuria.  Genitourinary: Negative for dysuria, frequency and hematuria.  Musculoskeletal: Negative for gait problem.  Skin: Negative for color change, pallor and rash.  Neurological: Positive for weakness. Negative for dizziness, syncope, light-headedness and headaches.  Hematological: Does not bruise/bleed easily.  Psychiatric/Behavioral: Negative for behavioral problems and confusion.      Allergies  Pseudoephedrine hcl  Home Medications   Prior to Admission medications   Medication Sig Start Date End Date Taking? Authorizing Provider  acetaminophen (TYLENOL) 650 MG CR tablet Take 650 mg by mouth every 8 (eight) hours as needed for pain.   Yes Historical Provider, MD  aspirin EC 81 MG tablet Take 81 mg by mouth at bedtime.   Yes Historical Provider, MD  carbamide peroxide (DEBROX) 6.5 % otic solution Place 1 drop into both ears daily as needed (ear wax).   Yes Historical Provider, MD  carvedilol (COREG) 25 MG tablet Take 25 mg by mouth 2 (two) times daily with a meal.   Yes Historical Provider, MD  Cyanocobalamin (B-12) 2500 MCG TABS Take 2,500 mcg by mouth daily at 12 noon.   Yes Historical Provider, MD  ferrous sulfate  325 (65 FE) MG EC tablet Take 325 mg by mouth 2 (two) times daily.   Yes Historical Provider, MD  furosemide (LASIX) 20 MG tablet Take 20 mg by mouth daily.   Yes Historical Provider, MD  hydrALAZINE (APRESOLINE) 25 MG tablet Take 25 mg by mouth 3 (three) times daily. 03/17/15  Yes Historical Provider, MD  insulin aspart (NOVOLOG) 100 UNIT/ML injection Inject 15 Units into the skin 3 (three) times daily with meals.     Yes Historical Provider, MD  insulin detemir (LEVEMIR) 100 UNIT/ML injection Inject 50 Units into the skin at bedtime.    Yes Historical Provider, MD  levothyroxine (SYNTHROID, LEVOTHROID) 112 MCG tablet Take 112 mcg by mouth daily before breakfast.   Yes Historical Provider, MD  meclizine (ANTIVERT) 25 MG tablet Take 25 mg by mouth 3 (three) times daily as needed for dizziness.   Yes Historical Provider, MD  Omega-3 Fatty Acids (FISH OIL PO) Take 1 capsule by mouth daily at 12 noon.   Yes Historical Provider, MD  rosuvastatin (CRESTOR) 10 MG tablet Take 10 mg by mouth at bedtime.   Yes Historical Provider, MD  verapamil (VERELAN PM) 120 MG 24 hr capsule Take 120 mg by mouth 2 (two) times daily.   Yes Historical Provider, MD   BP 185/68 mmHg  Pulse 72  Temp(Src) 98 F (36.7 C) (Rectal)  Resp 20  Ht 5\' 6"  (1.676 m)  Wt 283 lb (128.368 kg)  BMI 45.70 kg/m2  SpO2 98% Physical Exam  Constitutional: She is oriented to person, place, and time. She appears well-developed and well-nourished. No distress.  HENT:  Head: Normocephalic.  Eyes: Conjunctivae are normal. Pupils are equal, round, and reactive to light. No scleral icterus.  Neck: Normal range of motion. Neck supple. No thyromegaly present.  Cardiovascular: Normal rate and regular rhythm.  Exam reveals no gallop and no friction rub.   No murmur heard. Pulmonary/Chest: Effort normal and breath sounds normal. No respiratory distress. She has no wheezes. She has no rales.  Abdominal: Soft. Bowel sounds are normal. She exhibits no distension. There is no tenderness. There is no rebound.  Musculoskeletal: Normal range of motion.  Neurological: She is alert and oriented to person, place, and time.  Skin: Skin is warm and dry. No rash noted.  Psychiatric: She has a normal mood and affect. Her behavior is normal.    ED Course  Procedures (including critical care time) Labs Review Labs Reviewed  COMPREHENSIVE METABOLIC PANEL - Abnormal;  Notable for the following:    Glucose, Bld 117 (*)    BUN 40 (*)    Creatinine, Ser 2.78 (*)    Total Protein 9.9 (*)    Albumin 2.9 (*)    ALT 11 (*)    GFR calc non Af Amer 17 (*)    GFR calc Af Amer 19 (*)    All other components within normal limits  CBC WITH DIFFERENTIAL/PLATELET - Abnormal; Notable for the following:    RBC 2.78 (*)    Hemoglobin 7.1 (*)    HCT 24.4 (*)    MCH 25.5 (*)    MCHC 29.1 (*)    RDW 16.5 (*)    All other components within normal limits  BRAIN NATRIURETIC PEPTIDE - Abnormal; Notable for the following:    B Natriuretic Peptide 154.3 (*)    All other components within normal limits  I-STAT CG4 LACTIC ACID, ED - Abnormal; Notable for the following:    Lactic Acid, Venous  0.42 (*)    All other components within normal limits  CULTURE, BLOOD (ROUTINE X 2)  CULTURE, BLOOD (ROUTINE X 2)  PROCALCITONIN  VITAMIN B12  FOLATE  IRON AND TIBC  FERRITIN  RETICULOCYTES  I-STAT TROPOININ, ED  TYPE AND SCREEN  ABO/RH    Imaging Review Dg Chest 2 View  06/08/2015  CLINICAL DATA:  Shortness of breath, fatigue in fluid retention in both legs in the chest. Initial encounter. EXAM: CHEST  2 VIEW COMPARISON:  PA and lateral chest 11/10/2014 and 11/25/2014. CT chest 11/08/2014. FINDINGS: There is patchy airspace disease in the lingula and right lower lobe. The appearance is not grossly changed compared to the most recent examination. There is cardiomegaly without plain film evidence of pulmonary edema. No pneumothorax or pleural effusion. IMPRESSION: Patchy bilateral airspace disease appears unchanged compared to the most recent study most compatible with pneumonia, possibly atypical. The appearance is not suggestive of pulmonary edema. Electronically Signed   By: Inge Rise M.D.   On: 06/08/2015 20:08   Ct Chest Wo Contrast  06/09/2015  CLINICAL DATA:  Subacute onset of shortness of breath and dyspnea on exertion. Initial encounter. EXAM: CT CHEST WITHOUT  CONTRAST TECHNIQUE: Multidetector CT imaging of the chest was performed following the standard protocol without IV contrast. COMPARISON:  Chest radiograph performed earlier today at 7:57 p.m., and CT of the chest performed 11/08/2014 FINDINGS: Prominent bilateral nodular airspace opacities are seen, of varying size and scattered throughout both lungs. The distribution and size of these opacities is essentially unchanged from the prior study in June 2016. It is unusual for opacities to remain unchanged for 7 months; malignancy is considered unlikely, and atypical infection relatively unlikely. An inflammatory condition such as sarcoidosis might have such an appearance, though the relative lack of lymphadenopathy suggests against sarcoidosis. No significant pleural effusion or pneumothorax is seen. The heart is mildly enlarged. Trace pericardial fluid remains within normal limits. Visualized mediastinal nodes remain normal in size. The hila are difficult to fully assess due to surrounding opacities. The great vessels are grossly unremarkable in appearance. The visualized portions of the thyroid gland are unremarkable. No axillary lymphadenopathy is seen. The visualized portions of the liver and spleen are unremarkable. The visualized portions of the pancreas, adrenal glands and kidneys are within normal limits. Nonspecific bilateral perinephric stranding is noted. No acute osseous abnormalities are identified. Anterior bridging osteophytes are noted along the mid thoracic spine. A stable focal 1.2 cm lucency is noted along the right side of the manubrium. IMPRESSION: 1. Prominent bilateral nodular airspace opacities, of varying size and scattered throughout both lungs, are essentially unchanged from the prior study in June 2016. It is unusual for opacities to remain entirely unchanged for 7 months. Malignancy is considered unlikely, and atypical infection relatively unlikely. An inflammatory condition such as  sarcoidosis might have such an appearance, though the relative lack of adenopathy suggests against sarcoidosis. Pulmonary consultation would be helpful. 2. Mild cardiomegaly noted. 3. Stable focal 1.2 cm lucency along the right side of the manubrium, nonspecific in appearance. These results were called by telephone at the time of interpretation on 06/08/2015 at 11:48 pm to Dr. Tanna Furry, who verbally acknowledged these results. Electronically Signed   By: Garald Balding M.D.   On: 06/09/2015 00:00   I have personally reviewed and evaluated these images and lab results as part of my medical decision-making.   EKG Interpretation None      MDM   Final diagnoses:  Anemia,  unspecified anemia type  Pulmonary nodule  Dyspnea  Weakness     Patient with continued nodules on CT scan. Etiology would include metastatic disease, lymphadenopathy, inflammatory infectious process. These have not appreciably changed from 6, 2016, gait which is somewhat unusual despite etiology. She had declined bronchoscopy at that point. Discussed with her that she may need to undergo this for ultimate diagnosis. Plan is guaiac of her stool to ensure that her anemia is an anemia of chronic disease rather than of acute blood loss. She denies any bleeding or black stools. Discussed the case with hospitalist. Patient will be admitted.   Tanna Furry, MD 06/09/15 [phone]

## 2015-06-09 ENCOUNTER — Inpatient Hospital Stay (HOSPITAL_COMMUNITY): Payer: Medicare Other

## 2015-06-09 ENCOUNTER — Encounter (HOSPITAL_COMMUNITY): Payer: Self-pay | Admitting: Internal Medicine

## 2015-06-09 DIAGNOSIS — R0600 Dyspnea, unspecified: Secondary | ICD-10-CM

## 2015-06-09 DIAGNOSIS — N184 Chronic kidney disease, stage 4 (severe): Secondary | ICD-10-CM | POA: Diagnosis present

## 2015-06-09 DIAGNOSIS — R911 Solitary pulmonary nodule: Secondary | ICD-10-CM | POA: Diagnosis not present

## 2015-06-09 DIAGNOSIS — D649 Anemia, unspecified: Secondary | ICD-10-CM | POA: Insufficient documentation

## 2015-06-09 DIAGNOSIS — I5031 Acute diastolic (congestive) heart failure: Secondary | ICD-10-CM | POA: Diagnosis present

## 2015-06-09 DIAGNOSIS — E1122 Type 2 diabetes mellitus with diabetic chronic kidney disease: Secondary | ICD-10-CM | POA: Diagnosis present

## 2015-06-09 DIAGNOSIS — Z888 Allergy status to other drugs, medicaments and biological substances status: Secondary | ICD-10-CM | POA: Diagnosis not present

## 2015-06-09 DIAGNOSIS — E039 Hypothyroidism, unspecified: Secondary | ICD-10-CM | POA: Diagnosis present

## 2015-06-09 DIAGNOSIS — N179 Acute kidney failure, unspecified: Secondary | ICD-10-CM | POA: Diagnosis present

## 2015-06-09 DIAGNOSIS — J9601 Acute respiratory failure with hypoxia: Secondary | ICD-10-CM | POA: Diagnosis present

## 2015-06-09 DIAGNOSIS — Z794 Long term (current) use of insulin: Secondary | ICD-10-CM | POA: Diagnosis not present

## 2015-06-09 DIAGNOSIS — R918 Other nonspecific abnormal finding of lung field: Secondary | ICD-10-CM | POA: Diagnosis present

## 2015-06-09 DIAGNOSIS — I129 Hypertensive chronic kidney disease with stage 1 through stage 4 chronic kidney disease, or unspecified chronic kidney disease: Secondary | ICD-10-CM | POA: Diagnosis present

## 2015-06-09 DIAGNOSIS — Z6841 Body Mass Index (BMI) 40.0 and over, adult: Secondary | ICD-10-CM | POA: Diagnosis not present

## 2015-06-09 DIAGNOSIS — E785 Hyperlipidemia, unspecified: Secondary | ICD-10-CM | POA: Diagnosis present

## 2015-06-09 DIAGNOSIS — Z79899 Other long term (current) drug therapy: Secondary | ICD-10-CM | POA: Diagnosis not present

## 2015-06-09 DIAGNOSIS — E1129 Type 2 diabetes mellitus with other diabetic kidney complication: Secondary | ICD-10-CM | POA: Diagnosis present

## 2015-06-09 DIAGNOSIS — R531 Weakness: Secondary | ICD-10-CM | POA: Diagnosis not present

## 2015-06-09 DIAGNOSIS — D631 Anemia in chronic kidney disease: Secondary | ICD-10-CM | POA: Diagnosis present

## 2015-06-09 DIAGNOSIS — Z7982 Long term (current) use of aspirin: Secondary | ICD-10-CM | POA: Diagnosis not present

## 2015-06-09 DIAGNOSIS — E11649 Type 2 diabetes mellitus with hypoglycemia without coma: Secondary | ICD-10-CM | POA: Diagnosis not present

## 2015-06-09 DIAGNOSIS — D638 Anemia in other chronic diseases classified elsewhere: Secondary | ICD-10-CM | POA: Diagnosis present

## 2015-06-09 DIAGNOSIS — J189 Pneumonia, unspecified organism: Secondary | ICD-10-CM | POA: Diagnosis not present

## 2015-06-09 LAB — BASIC METABOLIC PANEL
Anion gap: 8 (ref 5–15)
BUN: 39 mg/dL — ABNORMAL HIGH (ref 6–20)
CHLORIDE: 111 mmol/L (ref 101–111)
CO2: 21 mmol/L — AB (ref 22–32)
Calcium: 8.8 mg/dL — ABNORMAL LOW (ref 8.9–10.3)
Creatinine, Ser: 2.63 mg/dL — ABNORMAL HIGH (ref 0.44–1.00)
GFR calc non Af Amer: 18 mL/min — ABNORMAL LOW (ref >60)
GFR, EST AFRICAN AMERICAN: 21 mL/min — AB (ref >60)
Glucose, Bld: 127 mg/dL — ABNORMAL HIGH (ref 65–99)
POTASSIUM: 4.8 mmol/L (ref 3.5–5.1)
SODIUM: 140 mmol/L (ref 135–145)

## 2015-06-09 LAB — CBC
HEMATOCRIT: 25 % — AB (ref 36.0–46.0)
Hemoglobin: 7.5 g/dL — ABNORMAL LOW (ref 12.0–15.0)
MCH: 26.5 pg (ref 26.0–34.0)
MCHC: 30 g/dL (ref 30.0–36.0)
MCV: 88.3 fL (ref 78.0–100.0)
Platelets: 309 10*3/uL (ref 150–400)
RBC: 2.83 MIL/uL — AB (ref 3.87–5.11)
RDW: 16.2 % — ABNORMAL HIGH (ref 11.5–15.5)
WBC: 7.3 10*3/uL (ref 4.0–10.5)

## 2015-06-09 LAB — GLUCOSE, CAPILLARY
GLUCOSE-CAPILLARY: 119 mg/dL — AB (ref 65–99)
GLUCOSE-CAPILLARY: 57 mg/dL — AB (ref 65–99)
Glucose-Capillary: 163 mg/dL — ABNORMAL HIGH (ref 65–99)
Glucose-Capillary: 124 mg/dL — ABNORMAL HIGH (ref 65–99)
Glucose-Capillary: 50 mg/dL — ABNORMAL LOW (ref 65–99)
Glucose-Capillary: 129 mg/dL — ABNORMAL HIGH (ref 65–99)
Glucose-Capillary: 98 mg/dL (ref 65–99)

## 2015-06-09 LAB — POC OCCULT BLOOD, ED: FECAL OCCULT BLD: NEGATIVE

## 2015-06-09 LAB — TROPONIN I: Troponin I: <0.03 ng/mL (ref <0.031)

## 2015-06-09 LAB — FOLATE: Folate: 16.4 ng/mL (ref >5.9)

## 2015-06-09 LAB — IRON AND TIBC
Iron: 24 ug/dL — ABNORMAL LOW (ref 28–170)
Saturation Ratios: 6 % — ABNORMAL LOW (ref 10.4–31.8)
TIBC: 386 ug/dL (ref 250–450)
UIBC: 362 ug/dL

## 2015-06-09 LAB — C-REACTIVE PROTEIN: CRP: 1.9 mg/dL — AB (ref <1.0)

## 2015-06-09 LAB — D-DIMER, QUANTITATIVE: D-Dimer, Quant: 1.12 ug/mL-FEU — ABNORMAL HIGH (ref 0.00–0.50)

## 2015-06-09 LAB — SEDIMENTATION RATE: Sed Rate: >140 mm/hr — ABNORMAL HIGH (ref 0–22)

## 2015-06-09 LAB — RETICULOCYTES
RBC.: 2.81 MIL/uL — ABNORMAL LOW (ref 3.87–5.11)
RETIC COUNT ABSOLUTE: 61.8 10*3/uL (ref 19.0–186.0)
RETIC CT PCT: 2.2 % (ref 0.4–3.1)

## 2015-06-09 LAB — PROCALCITONIN

## 2015-06-09 LAB — VITAMIN B12: Vitamin B-12: 3796 pg/mL — ABNORMAL HIGH (ref 180–914)

## 2015-06-09 LAB — FERRITIN: FERRITIN: 43 ng/mL (ref 11–307)

## 2015-06-09 MED ORDER — INSULIN ASPART 100 UNIT/ML ~~LOC~~ SOLN
15.0000 [IU] | Freq: Three times a day (TID) | SUBCUTANEOUS | Status: DC
  Administered 2015-06-09 – 2015-06-10 (×4): 15 [IU] via SUBCUTANEOUS

## 2015-06-09 MED ORDER — CARVEDILOL 25 MG PO TABS
25.0000 mg | ORAL_TABLET | Freq: Two times a day (BID) | ORAL | Status: DC
  Administered 2015-06-09 – 2015-06-13 (×9): 25 mg via ORAL
  Filled 2015-06-09 (×9): qty 1

## 2015-06-09 MED ORDER — ACETAMINOPHEN 325 MG PO TABS
650.0000 mg | ORAL_TABLET | Freq: Four times a day (QID) | ORAL | Status: DC | PRN
  Administered 2015-06-09: 650 mg via ORAL
  Filled 2015-06-09: qty 2

## 2015-06-09 MED ORDER — FERROUS SULFATE 325 (65 FE) MG PO TABS
325.0000 mg | ORAL_TABLET | Freq: Two times a day (BID) | ORAL | Status: DC
  Administered 2015-06-09 – 2015-06-13 (×9): 325 mg via ORAL
  Filled 2015-06-09 (×9): qty 1

## 2015-06-09 MED ORDER — ACETAMINOPHEN 650 MG RE SUPP: 650.0000 mg | Freq: Four times a day (QID) | RECTAL | Status: DC | PRN

## 2015-06-09 MED ORDER — HEPARIN SODIUM (PORCINE) 5000 UNIT/ML IJ SOLN
5000.0000 [IU] | Freq: Three times a day (TID) | INTRAMUSCULAR | Status: DC
  Administered 2015-06-09 – 2015-06-13 (×11): 5000 [IU] via SUBCUTANEOUS
  Filled 2015-06-09 (×9): qty 1

## 2015-06-09 MED ORDER — FUROSEMIDE 10 MG/ML IJ SOLN
40.0000 mg | Freq: Once | INTRAMUSCULAR | Status: AC
  Administered 2015-06-09: 40 mg via INTRAVENOUS
  Filled 2015-06-09: qty 4

## 2015-06-09 MED ORDER — OMEGA-3-ACID ETHYL ESTERS 1 G PO CAPS
1.0000 g | ORAL_CAPSULE | Freq: Every day | ORAL | Status: DC
  Administered 2015-06-09 – 2015-06-13 (×4): 1 g via ORAL
  Filled 2015-06-09 (×4): qty 1

## 2015-06-09 MED ORDER — VITAMIN B-12 1000 MCG PO TABS
2500.0000 ug | ORAL_TABLET | Freq: Every day | ORAL | Status: DC
  Administered 2015-06-09 – 2015-06-13 (×4): 2500 ug via ORAL
  Filled 2015-06-09 (×4): qty 3

## 2015-06-09 MED ORDER — HYDRALAZINE HCL 25 MG PO TABS
25.0000 mg | ORAL_TABLET | Freq: Three times a day (TID) | ORAL | Status: DC
  Administered 2015-06-09 – 2015-06-13 (×13): 25 mg via ORAL
  Filled 2015-06-09 (×14): qty 1

## 2015-06-09 MED ORDER — VERAPAMIL HCL ER 120 MG PO TBCR
120.0000 mg | EXTENDED_RELEASE_TABLET | Freq: Two times a day (BID) | ORAL | Status: DC
  Administered 2015-06-09 – 2015-06-13 (×8): 120 mg via ORAL
  Filled 2015-06-09 (×11): qty 1

## 2015-06-09 MED ORDER — LEVOFLOXACIN IN D5W 750 MG/150ML IV SOLN
750.0000 mg | Freq: Once | INTRAVENOUS | Status: AC
  Administered 2015-06-09: 750 mg via INTRAVENOUS
  Filled 2015-06-09: qty 150

## 2015-06-09 MED ORDER — ONDANSETRON HCL 4 MG/2ML IJ SOLN: 4.0000 mg | Freq: Four times a day (QID) | INTRAMUSCULAR | Status: DC | PRN

## 2015-06-09 MED ORDER — GLUCOSE 40 % PO GEL
ORAL | Status: AC
  Administered 2015-06-09: 37.5 g
  Filled 2015-06-09: qty 1

## 2015-06-09 MED ORDER — ONDANSETRON HCL 4 MG PO TABS: 4.0000 mg | ORAL_TABLET | Freq: Four times a day (QID) | ORAL | Status: DC | PRN

## 2015-06-09 MED ORDER — LEVOFLOXACIN IN D5W 750 MG/150ML IV SOLN
750.0000 mg | INTRAVENOUS | Status: DC
  Administered 2015-06-10 – 2015-06-12 (×2): 750 mg via INTRAVENOUS
  Filled 2015-06-09 (×3): qty 150

## 2015-06-09 MED ORDER — FUROSEMIDE 20 MG PO TABS
20.0000 mg | ORAL_TABLET | Freq: Every day | ORAL | Status: DC
  Administered 2015-06-09: 20 mg via ORAL
  Filled 2015-06-09: qty 1

## 2015-06-09 MED ORDER — INSULIN ASPART 100 UNIT/ML ~~LOC~~ SOLN
0.0000 [IU] | Freq: Three times a day (TID) | SUBCUTANEOUS | Status: DC
  Administered 2015-06-09: 1 [IU] via SUBCUTANEOUS
  Administered 2015-06-10: 2 [IU] via SUBCUTANEOUS
  Administered 2015-06-11: 5 [IU] via SUBCUTANEOUS
  Administered 2015-06-12: 2 [IU] via SUBCUTANEOUS
  Administered 2015-06-12 – 2015-06-13 (×3): 1 [IU] via SUBCUTANEOUS
  Administered 2015-06-13: 2 [IU] via SUBCUTANEOUS

## 2015-06-09 MED ORDER — INSULIN DETEMIR 100 UNIT/ML ~~LOC~~ SOLN
50.0000 [IU] | Freq: Every day | SUBCUTANEOUS | Status: DC
  Filled 2015-06-09 (×2): qty 0.5

## 2015-06-09 MED ORDER — LEVOTHYROXINE SODIUM 112 MCG PO TABS
112.0000 ug | ORAL_TABLET | Freq: Every day | ORAL | Status: DC
  Administered 2015-06-09 – 2015-06-13 (×4): 112 ug via ORAL
  Filled 2015-06-09 (×4): qty 1

## 2015-06-09 MED ORDER — MECLIZINE HCL 25 MG PO TABS: 25.0000 mg | ORAL_TABLET | Freq: Three times a day (TID) | ORAL | Status: DC | PRN

## 2015-06-09 MED ORDER — ROSUVASTATIN CALCIUM 10 MG PO TABS
10.0000 mg | ORAL_TABLET | Freq: Every day | ORAL | Status: DC
  Administered 2015-06-09 – 2015-06-12 (×4): 10 mg via ORAL
  Filled 2015-06-09 (×4): qty 1

## 2015-06-09 NOTE — H&P (Addendum)
[facility] History and Physical  Sheri Pearson [id number] DOB: [birth year] DOA: 06/08/2015  Referring physician: Dr. Jeneen Rinks. PCP: Arcelia Jew, [place]  Specialists: Dr. Florene Glen. Nephrologist.  Chief Complaint: Shortness of breath.  HPI: Sheri Pearson is a 70 y.o. female with history of chronic kidney disease stage IV, diabetes mellitus type 2, hypertension, hyperlipidemia and chronic anemia presents to the ER because of increasing shortness of breath. Patient has been having these symptoms over the last 2 weeks. Patient's shortness of breath is present at rest and also increases on exertion. Denies any chest pain. Has been having some nonproductive cough. Patient was admitted in July last year and at that time patient had abnormal chest x-ray. Today's chest x-ray was still showing persistent infiltrates with CT chest showing continuing infiltrates comparable to the old. Patient states she may have gained 10 pounds in weight. Patient states 2 weeks ago she had developed some lower extremity edema and her primary care physician had started her on Lasix. Despite taking which patient is still short of breath. Patient has been admitted for acute respiratory failure and further management. Patient's hemoglobin is around 7, one gram drop from previous. Stool for occult blood has been negative. Patient states her nephrologist as advised that she may need IV iron for her chronic anemia.   Review of Systems: As presented in the history of presenting illness, rest negative.  Past Medical History  Diagnosis Date  . Diabetes mellitus without complication ([place])   . Hypertension   . Pneumonia    History reviewed. No pertinent past surgical history. Social History:  reports that she has never smoked. She does not have any smokeless tobacco history on file. She reports that she does not drink alcohol. Her drug history is not on file. Where does patient live  home. Can patient participate in ADLs?   Yes.  Allergies  Allergen Reactions  . Pseudoephedrine Hcl Hives    Family History:  Family History  Problem Relation Age of Onset  . Heart disease Father   . Heart disease Mother   . Heart disease Brother       Prior to Admission medications   Medication Sig Start Date End Date Taking? Authorizing Provider  acetaminophen (TYLENOL) 650 MG CR tablet Take 650 mg by mouth every 8 (eight) hours as needed for pain.   Yes Historical Provider, MD  aspirin EC 81 MG tablet Take 81 mg by mouth at bedtime.   Yes Historical Provider, MD  carbamide peroxide (DEBROX) 6.5 % otic solution Place 1 drop into both ears daily as needed (ear wax).   Yes Historical Provider, MD  carvedilol (COREG) 25 MG tablet Take 25 mg by mouth 2 (two) times daily with a meal.   Yes Historical Provider, MD  Cyanocobalamin (B-12) 2500 MCG TABS Take 2,500 mcg by mouth daily at 12 noon.   Yes Historical Provider, MD  ferrous sulfate 325 (65 FE) MG EC tablet Take 325 mg by mouth 2 (two) times daily.   Yes Historical Provider, MD  furosemide (LASIX) 20 MG tablet Take 20 mg by mouth daily.   Yes Historical Provider, MD  hydrALAZINE (APRESOLINE) 25 MG tablet Take 25 mg by mouth 3 (three) times daily. 03/17/15  Yes Historical Provider, MD  insulin aspart (NOVOLOG) 100 UNIT/ML injection Inject 15 Units into the skin 3 (three) times daily with meals.    Yes Historical Provider, MD  insulin detemir (LEVEMIR) 100 UNIT/ML injection Inject 50 Units into the skin at bedtime.  Yes Historical Provider, MD  levothyroxine (SYNTHROID, LEVOTHROID) 112 MCG tablet Take 112 mcg by mouth daily before breakfast.   Yes Historical Provider, MD  meclizine (ANTIVERT) 25 MG tablet Take 25 mg by mouth 3 (three) times daily as needed for dizziness.   Yes Historical Provider, MD  Omega-3 Fatty Acids (FISH OIL PO) Take 1 capsule by mouth daily at 12 noon.   Yes Historical Provider, MD  rosuvastatin (CRESTOR) 10 MG tablet Take 10 mg by mouth at bedtime.    Yes Historical Provider, MD  verapamil (VERELAN PM) 120 MG 24 hr capsule Take 120 mg by mouth 2 (two) times daily.   Yes Historical Provider, MD    Physical Exam: Filed Vitals:   06/08/15 2330 06/09/15 0000 06/09/15 0012 06/09/15 0255  BP: 185/68 177/65  171/83  Pulse: 72 66  77  Temp:   98 F (36.7 C)   TempSrc:   Rectal   Resp:    20  Height:      Weight:      SpO2: 98% 96%  99%     General:  Moderately built and nourished.  Eyes:  Anicteric mild pallor.  ENT:  No discharge from the ears eyes nose and mouth.  Neck:  No mass felt. JVD mildly elevated.  Cardiovascular:  S1 and S2 heard.  Respiratory:  No rhonchi or crepitations.  Abdomen:  Soft nontender bowel sounds present.  Skin:  No rash.  Musculoskeletal:  No edema.  Psychiatric:  Appears normal.  Neurologic:  Alert awake oriented to time place and person. Moves all extremities.  Labs on Admission:  Basic Metabolic Panel:  Recent Labs Lab 06/08/15 2002  NA 140  K 4.9  CL 109  CO2 24  GLUCOSE 117*  BUN 40*  CREATININE 2.78*  CALCIUM 9.0   Liver Function Tests:  Recent Labs Lab 06/08/15 2002  AST 16  ALT 11*  ALKPHOS 51  BILITOT 0.4  PROT 9.9*  ALBUMIN 2.9*   No results for input(s): LIPASE, AMYLASE in the last 168 hours. No results for input(s): AMMONIA in the last 168 hours. CBC:  Recent Labs Lab 06/08/15 2002  WBC 7.2  NEUTROABS 4.9  HGB 7.1*  HCT 24.4*  MCV 87.8  PLT 344   Cardiac Enzymes: No results for input(s): CKTOTAL, CKMB, CKMBINDEX, TROPONINI in the last 168 hours.  BNP (last 3 results)  Recent Labs  06/08/15 2200  BNP 154.3*    ProBNP (last 3 results) No results for input(s): PROBNP in the last 8760 hours.  CBG: No results for input(s): GLUCAP in the last 168 hours.  Radiological Exams on Admission: Dg Chest 2 View  06/08/2015  CLINICAL DATA:  Shortness of breath, fatigue in fluid retention in both legs in the chest. Initial encounter. EXAM: CHEST  2  VIEW COMPARISON:  PA and lateral chest 11/10/2014 and 11/25/2014. CT chest 11/08/2014. FINDINGS: There is patchy airspace disease in the lingula and right lower lobe. The appearance is not grossly changed compared to the most recent examination. There is cardiomegaly without plain film evidence of pulmonary edema. No pneumothorax or pleural effusion. IMPRESSION: Patchy bilateral airspace disease appears unchanged compared to the most recent study most compatible with pneumonia, possibly atypical. The appearance is not suggestive of pulmonary edema. Electronically Signed   By: Inge Rise M.D.   On: 06/08/2015 20:08   Ct Chest Wo Contrast  06/09/2015  CLINICAL DATA:  Subacute onset of shortness of breath and dyspnea on exertion. Initial encounter.  EXAM: CT CHEST WITHOUT CONTRAST TECHNIQUE: Multidetector CT imaging of the chest was performed following the standard protocol without IV contrast. COMPARISON:  Chest radiograph performed earlier today at 7:57 p.m., and CT of the chest performed 11/08/2014 FINDINGS: Prominent bilateral nodular airspace opacities are seen, of varying size and scattered throughout both lungs. The distribution and size of these opacities is essentially unchanged from the prior study in June 2016. It is unusual for opacities to remain unchanged for 7 months; malignancy is considered unlikely, and atypical infection relatively unlikely. An inflammatory condition such as sarcoidosis might have such an appearance, though the relative lack of lymphadenopathy suggests against sarcoidosis. No significant pleural effusion or pneumothorax is seen. The heart is mildly enlarged. Trace pericardial fluid remains within normal limits. Visualized mediastinal nodes remain normal in size. The hila are difficult to fully assess due to surrounding opacities. The great vessels are grossly unremarkable in appearance. The visualized portions of the thyroid gland are unremarkable. No axillary lymphadenopathy  is seen. The visualized portions of the liver and spleen are unremarkable. The visualized portions of the pancreas, adrenal glands and kidneys are within normal limits. Nonspecific bilateral perinephric stranding is noted. No acute osseous abnormalities are identified. Anterior bridging osteophytes are noted along the mid thoracic spine. A stable focal 1.2 cm lucency is noted along the right side of the manubrium. IMPRESSION: 1. Prominent bilateral nodular airspace opacities, of varying size and scattered throughout both lungs, are essentially unchanged from the prior study in June 2016. It is unusual for opacities to remain entirely unchanged for 7 months. Malignancy is considered unlikely, and atypical infection relatively unlikely. An inflammatory condition such as sarcoidosis might have such an appearance, though the relative lack of adenopathy suggests against sarcoidosis. Pulmonary consultation would be helpful. 2. Mild cardiomegaly noted. 3. Stable focal 1.2 cm lucency along the right side of the manubrium, nonspecific in appearance. These results were called by telephone at the time of interpretation on 06/08/2015 at 11:48 pm to Dr. Tanna Furry, who verbally acknowledged these results. Electronically Signed   By: Garald Balding M.D.   On: 06/09/2015 00:00    EKG: Independently reviewed.  First-degree AV block.  Assessment/Plan Principal Problem:   Acute respiratory failure with hypoxia (HCC) Active Problems:   Essential hypertension   DM (diabetes mellitus), type 2 with renal complications (HCC)   Chronic anemia   CKD (chronic kidney disease) stage 4, GFR 15-29 ml/min (HCC)  1. Acute respiratory failure with hypoxia - cause not clear. Could be multifactorial. Since patient has abnormal CT chest with multiple infiltrates I have consulted pulmonary critical care for further recommendations. I have also ordered 2-D echo to check LV function and also rule out pulmonary hypertension. Patient's hypoxia  and shortness of breath and also be from anemia. May need transfusion if still short of breath and neck there is no other cause. Check d-dimer. For now patient is on Levaquin which can be discontinued if procalcitonin levels are negative. 2. Diabetes mellitus type 2 - continue home medications with sliding scale coverage. 3. Chronic kidney disease stage IV - patient was just started on Lasix 20 mg which will be continued. Patient follows up with Dr. Florene Glen nephrologist. Closely follow metabolic panel. 4. Hypertension - continue home medications. 5. Chronic anemia - for see #1.  6. Hypothyroidism - continue Synthroid. 7. Hyperlipidemia on statins.   DVT Prophylaxis - Heparin. Code Status: Full code.  Family Communication: Patient's son.  Disposition Plan: Admit to inpatient.  Herman Mell N. [facility] Pager [phone].  If 7PM-7AM, please contact night-coverage [email or web address] Password [email or web address] 06/09/2015, 3:00 AM

## 2015-06-09 NOTE — ED Notes (Signed)
Armondo, son, [phone] to be contacted with room assignment

## 2015-06-09 NOTE — Progress Notes (Signed)
  Echocardiogram 2D Echocardiogram has been performed.  Bobbye Charleston 06/09/2015, 12:28 PM

## 2015-06-09 NOTE — Progress Notes (Signed)
Hypoglycemic Event  CBG:57  Treatment: 1 tube instant glucose  Symptoms: Sweaty and Shaky  Follow-up CBG: Time:2209 CBG Result:98 Possible Reasons for Event: medication regimen                                                                                                                 Comments/MD notified:followed hypoglycemia protocol    Sheri Pearson [name]

## 2015-06-09 NOTE — Progress Notes (Signed)
[facility] PROGRESS NOTE   Sheri Pearson [id number] DOB: 02-Sep-1945 DOA: 06/08/2015 PCP: Arcelia Jew, FNP  HPI/Subjective: Feels okay, still has shortness of breath.  Assessment/Plan: Principal Problem:   Acute respiratory failure with hypoxia (HCC) Active Problems:   Essential hypertension   DM (diabetes mellitus), type 2 with renal complications (HCC)   Chronic anemia   CKD (chronic kidney disease) stage 4, GFR 15-29 ml/min [facility])   Patient admitted earlier today by my colleague Dr. Hal Hope. This is a no chart note, patient seen and examined and data base reviewed. Shortness of breath, improved after Lasix. Has pulmonary nodules, not changed since 7/16, pulmonary consulted.  Code Status: Full Code Family Communication: Plan discussed with the patient. Disposition Plan: Remains inpatient Diet: Diet heart healthy/carb modified Room service appropriate?: Yes; Fluid consistency:: Thin; Fluid restriction:: 1200 mL Fluid  Consultants:  None  Procedures:  None  Antibiotics:  None   Objective: Filed Vitals:   06/09/15 0813 06/09/15 1116  BP: 177/41 176/58  Pulse: 76   Temp: 98.2 F (36.8 C)   Resp: 18     Intake/Output Summary (Last 24 hours) at 06/09/15 1209 Last data filed at 06/09/15 1006  Gross per 24 hour  Intake    420 ml  Output    376 ml  Net     44 ml   Filed Weights   06/08/15 1932 06/09/15 0327  Weight: 128.368 kg (283 lb) 126.009 kg (277 lb 12.8 oz)    Exam: General: Alert and awake, oriented x3, not in any acute distress. HEENT: anicteric sclera, pupils reactive to light and accommodation, EOMI CVS: S1-S2 clear, no murmur rubs or gallops Chest: clear to auscultation bilaterally, no wheezing, rales or rhonchi Abdomen: soft nontender, nondistended, normal bowel sounds, no organomegaly Extremities: no cyanosis, clubbing or edema noted bilaterally Neuro: Cranial nerves II-XII intact, no focal neurological deficits  Data  Reviewed: Basic Metabolic Panel:  Recent Labs Lab 06/08/15 2002 06/09/15 0847  NA 140 140  K 4.9 4.8  CL 109 111  CO2 24 21*  GLUCOSE 117* 127*  BUN 40* 39*  CREATININE 2.78* 2.63*  CALCIUM 9.0 8.8*   Liver Function Tests:  Recent Labs Lab 06/08/15 2002  AST 16  ALT 11*  ALKPHOS 51  BILITOT 0.4  PROT 9.9*  ALBUMIN 2.9*   No results for input(s): LIPASE, AMYLASE in the last 168 hours. No results for input(s): AMMONIA in the last 168 hours. CBC:  Recent Labs Lab 06/08/15 2002 06/09/15 0847  WBC 7.2 7.3  NEUTROABS 4.9  --   HGB 7.1* 7.5*  HCT 24.4* 25.0*  MCV 87.8 88.3  PLT 344 309   Cardiac Enzymes:  Recent Labs Lab 06/09/15 0847  TROPONINI <0.03   BNP (last 3 results)  Recent Labs  06/08/15 2200  BNP 154.3*    ProBNP (last 3 results) No results for input(s): PROBNP in the last 8760 hours.  CBG:  Recent Labs Lab 06/09/15 0348 06/09/15 0812 06/09/15 1148  GLUCAP 163* 124* 50*    Micro No results found for this or any previous visit (from the past 240 hour(s)).   Studies: Dg Chest 2 View  06/08/2015  CLINICAL DATA:  Shortness of breath, fatigue in fluid retention in both legs in the chest. Initial encounter. EXAM: CHEST  2 VIEW COMPARISON:  PA and lateral chest 11/10/2014 and 11/25/2014. CT chest 11/08/2014. FINDINGS: There is patchy airspace disease in the lingula and right lower lobe. The appearance is not grossly changed compared  to the most recent examination. There is cardiomegaly without plain film evidence of pulmonary edema. No pneumothorax or pleural effusion. IMPRESSION: Patchy bilateral airspace disease appears unchanged compared to the most recent study most compatible with pneumonia, possibly atypical. The appearance is not suggestive of pulmonary edema. Electronically Signed   By: Inge Rise M.D.   On: 06/08/2015 20:08   Ct Chest Wo Contrast  06/09/2015  CLINICAL DATA:  Subacute onset of shortness of breath and dyspnea on  exertion. Initial encounter. EXAM: CT CHEST WITHOUT CONTRAST TECHNIQUE: Multidetector CT imaging of the chest was performed following the standard protocol without IV contrast. COMPARISON:  Chest radiograph performed earlier today at 7:57 p.m., and CT of the chest performed 11/08/2014 FINDINGS: Prominent bilateral nodular airspace opacities are seen, of varying size and scattered throughout both lungs. The distribution and size of these opacities is essentially unchanged from the prior study in June 2016. It is unusual for opacities to remain unchanged for 7 months; malignancy is considered unlikely, and atypical infection relatively unlikely. An inflammatory condition such as sarcoidosis might have such an appearance, though the relative lack of lymphadenopathy suggests against sarcoidosis. No significant pleural effusion or pneumothorax is seen. The heart is mildly enlarged. Trace pericardial fluid remains within normal limits. Visualized mediastinal nodes remain normal in size. The hila are difficult to fully assess due to surrounding opacities. The great vessels are grossly unremarkable in appearance. The visualized portions of the thyroid gland are unremarkable. No axillary lymphadenopathy is seen. The visualized portions of the liver and spleen are unremarkable. The visualized portions of the pancreas, adrenal glands and kidneys are within normal limits. Nonspecific bilateral perinephric stranding is noted. No acute osseous abnormalities are identified. Anterior bridging osteophytes are noted along the mid thoracic spine. A stable focal 1.2 cm lucency is noted along the right side of the manubrium. IMPRESSION: 1. Prominent bilateral nodular airspace opacities, of varying size and scattered throughout both lungs, are essentially unchanged from the prior study in June 2016. It is unusual for opacities to remain entirely unchanged for 7 months. Malignancy is considered unlikely, and atypical infection relatively  unlikely. An inflammatory condition such as sarcoidosis might have such an appearance, though the relative lack of adenopathy suggests against sarcoidosis. Pulmonary consultation would be helpful. 2. Mild cardiomegaly noted. 3. Stable focal 1.2 cm lucency along the right side of the manubrium, nonspecific in appearance. These results were called by telephone at the time of interpretation on 06/08/2015 at 11:48 pm to Dr. Tanna Furry, who verbally acknowledged these results. Electronically Signed   By: Garald Balding M.D.   On: 06/09/2015 00:00    Scheduled Meds: . carvedilol  25 mg Oral BID WC  . ferrous sulfate  325 mg Oral BID WC  . furosemide  20 mg Oral Daily  . heparin  5,000 Units Subcutaneous 3 times per day  . hydrALAZINE  25 mg Oral 3 times per day  . insulin aspart  0-9 Units Subcutaneous TID WC  . insulin aspart  15 Units Subcutaneous TID WC  . insulin detemir  50 Units Subcutaneous QHS  . [START ON 06/10/2015] levofloxacin (LEVAQUIN) IV  750 mg Intravenous Q48H  . levothyroxine  112 mcg Oral QAC breakfast  . omega-3 acid ethyl esters  1 g Oral Daily  . rosuvastatin  10 mg Oral QHS  . verapamil  120 mg Oral BID  . vitamin B-12  2,500 mcg Oral Q1200   Continuous Infusions:      Time spent:  35 minutes    [facility]  [facility] Pager [phone] If 7PM-7AM, please contact night-coverage at [email or web address], password [facility] 06/09/2015, 12:09 PM  LOS: 0 days

## 2015-06-09 NOTE — Progress Notes (Addendum)
Error: Wrong patient  Patient asked again if she wants to go back to bed, she insisted to get back later.

## 2015-06-09 NOTE — ED Notes (Signed)
Attempted report 

## 2015-06-09 NOTE — ED Notes (Signed)
Admitting MD at bedside.

## 2015-06-09 NOTE — Consult Note (Signed)
Name: Sheri Pearson MRN: [id number] DOB: 12-Feb-1946    ADMISSION DATE:  06/08/2015 CONSULTATION DATE:  06/09/15  REFERRING MD :  Dr. Hartford Poli   CHIEF COMPLAINT:  Bilateral pulmonary nodules    HISTORY OF PRESENT ILLNESS:  70 y/o F, never smoker, with PMH of HTN and DM who presented to [facility] on 1/23 with complaints of shortness of breath.    Prior admission in 11/2014 with abnormal chest xray / CT (diffuse bilateral nodular airspace disease), malignant hypertension, fever to 102.5 in setting of UTI and AKI.  ESR 137 (11/10/14).  Seen in office by Dr. Melvyn Novas with follow up CXR that was unchanged but no follow up after 11/2014.  Labs at that time - ANCA negative, GBM AB negative, C3 wnl, C4 47 (upper limit 44), UA with small Hgb, protein electrophoresis Gamma Globulin 3.0, total globulin 5.3, ANA negative, HIV non-reactive, and quantiferon gold negative.    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  She is a never smoker.  She has known about her renal disease since 2009.  Her father and brothers also had kidney disease.  She reports she has been more tired since before Christmas 2016 which is unusual for her.  She continues to work full time.  Since the beginning of January, she has noted progressively worsening shortness of breath - unable to ambulate normal distances without stopping for a rest break.  Occasional dry cough.  The week prior to admission, she has been unable to lie flat - 3 pillow orthopnea, 10 lb weight gain and lower extremity swelling.  She denies known fevers, chills, nausea / vomiting.  She was started on lasix prior to admit per Dr. Florene Glen which helped with dyspnea.    The patient was admitted per [facility] for further evaluation of SOB.  Initial evaluation notable for CXR with diffuse bilateral airspace disease.  Follow up CT demonstrated diffuse bilateral nodular opacities that were essentially unchanged from 10/2014.  Labs - Na 140, K 4.9, Cr 2.78,  glucose 117, BNP 154, troponin 0.0, WBC 7.2 (normal eosinophil count), Hgb 7.1, and platelets 344.    PCCM consulted for evaluation of dyspnea & abnormal CXR.    PAST MEDICAL HISTORY :   has a past medical history of Diabetes mellitus without complication ([place]); Hypertension; and Pneumonia.  has no past surgical history on file.   Prior to Admission medications   Medication Sig Start Date End Date Taking? Authorizing Provider  acetaminophen (TYLENOL) 650 MG CR tablet Take 650 mg by mouth every 8 (eight) hours as needed for pain.   Yes Historical Provider, MD  aspirin EC 81 MG tablet Take 81 mg by mouth at bedtime.   Yes Historical Provider, MD  carbamide peroxide (DEBROX) 6.5 % otic solution Place 1 drop into both ears daily as needed (ear wax).   Yes Historical Provider, MD  carvedilol (COREG) 25 MG tablet Take 25 mg by mouth 2 (two) times daily with a meal.   Yes Historical Provider, MD  Cyanocobalamin (B-12) 2500 MCG TABS Take 2,500 mcg by mouth daily at 12 noon.   Yes Historical Provider, MD  ferrous sulfate 325 (65 FE) MG EC tablet Take 325 mg by mouth 2 (two) times daily.   Yes Historical Provider, MD  furosemide (LASIX) 20 MG tablet Take 20 mg by mouth daily.   Yes Historical Provider, MD  hydrALAZINE (APRESOLINE) 25 MG tablet Take 25 mg by mouth 3 (three)  times daily. 03/17/15  Yes Historical Provider, MD  insulin aspart (NOVOLOG) 100 UNIT/ML injection Inject 15 Units into the skin 3 (three) times daily with meals.    Yes Historical Provider, MD  insulin detemir (LEVEMIR) 100 UNIT/ML injection Inject 50 Units into the skin at bedtime.    Yes Historical Provider, MD  levothyroxine (SYNTHROID, LEVOTHROID) 112 MCG tablet Take 112 mcg by mouth daily before breakfast.   Yes Historical Provider, MD  meclizine (ANTIVERT) 25 MG tablet Take 25 mg by mouth 3 (three) times daily as needed for dizziness.   Yes Historical Provider, MD  Omega-3 Fatty Acids (FISH OIL PO) Take 1 capsule by mouth daily  at 12 noon.   Yes Historical Provider, MD  rosuvastatin (CRESTOR) 10 MG tablet Take 10 mg by mouth at bedtime.   Yes Historical Provider, MD  verapamil (VERELAN PM) 120 MG 24 hr capsule Take 120 mg by mouth 2 (two) times daily.   Yes Historical Provider, MD   Allergies  Allergen Reactions  . Pseudoephedrine Hcl Hives    FAMILY HISTORY:  family history includes Heart disease in her brother, father, and mother.   SOCIAL HISTORY:  reports that she has never smoked. She does not have any smokeless tobacco history on file. She reports that she does not drink alcohol.  REVIEW OF SYSTEMS:   Constitutional: Negative for fever, chills, weight loss, malaise/fatigue and diaphoresis.  HENT: Negative for hearing loss, ear pain, nosebleeds, congestion, sore throat, neck pain, tinnitus and ear discharge.   Eyes: Negative for blurred vision, double vision, photophobia, pain, discharge and redness.  Respiratory: Negative for hemoptysis, sputum production, wheezing and stridor. Reports SOB, DOE, 3 pillow orthopnea, occasional dry cough.  Cardiovascular: Negative for chest pain, palpitations, claudication, and PND. Reports BLE swelling.  Gastrointestinal: Negative for heartburn, nausea, vomiting, abdominal pain, diarrhea, constipation, blood in stool and melena.  Genitourinary: Negative for dysuria, urgency, frequency, hematuria and flank pain.  Musculoskeletal: Negative for myalgias, back pain, joint pain and falls.  Skin: Negative for itching and rash.  Neurological: Negative for dizziness, tingling, tremors, sensory change, speech change, focal weakness, seizures, loss of consciousness, weakness and headaches.  Endo/Heme/Allergies: Negative for environmental allergies and polydipsia. Does not bruise/bleed easily.  SUBJECTIVE:   VITAL SIGNS: Temp:  [98 F (36.7 C)-98.5 F (36.9 C)] 98.2 F (36.8 C) (01/24 0813) Pulse Rate:  [66-79] 76 (01/24 0813) Resp:  [18-20] 18 (01/24 0813) BP:  (171-192)/(41-83) 177/41 mmHg (01/24 0813) SpO2:  [94 %-99 %] 96 % (01/24 0813) Weight:  [277 lb 12.8 oz (126.009 kg)-283 lb (128.368 kg)] 277 lb 12.8 oz (126.009 kg) (01/24 0327)  PHYSICAL EXAMINATION: General:  Obese female in NAD Neuro:  AAOx4, speech clear, MAE HEENT:  MM pink/moist, no LAN Cardiovascular:  s1s2 rrr, no m/r/g  Lungs:  Even/non-labored, lungs bilaterally clear Abdomen:  NTND, bsx4 active  Musculoskeletal:  No acute deformities  Skin:  Warm/dry, no edema   Recent Labs Lab 06/08/15 2002  NA 140  K 4.9  CL 109  CO2 24  BUN 40*  CREATININE 2.78*  GLUCOSE 117*    Recent Labs Lab 06/08/15 2002  HGB 7.1*  HCT 24.4*  WBC 7.2  PLT 344   Dg Chest 2 View  06/08/2015  CLINICAL DATA:  Shortness of breath, fatigue in fluid retention in both legs in the chest. Initial encounter. EXAM: CHEST  2 VIEW COMPARISON:  PA and lateral chest 11/10/2014 and 11/25/2014. CT chest 11/08/2014. FINDINGS: There is patchy airspace disease  in the lingula and right lower lobe. The appearance is not grossly changed compared to the most recent examination. There is cardiomegaly without plain film evidence of pulmonary edema. No pneumothorax or pleural effusion. IMPRESSION: Patchy bilateral airspace disease appears unchanged compared to the most recent study most compatible with pneumonia, possibly atypical. The appearance is not suggestive of pulmonary edema. Electronically Signed   By: Inge Rise M.D.   On: 06/08/2015 20:08   Ct Chest Wo Contrast  06/09/2015  CLINICAL DATA:  Subacute onset of shortness of breath and dyspnea on exertion. Initial encounter. EXAM: CT CHEST WITHOUT CONTRAST TECHNIQUE: Multidetector CT imaging of the chest was performed following the standard protocol without IV contrast. COMPARISON:  Chest radiograph performed earlier today at 7:57 p.m., and CT of the chest performed 11/08/2014 FINDINGS: Prominent bilateral nodular airspace opacities are seen, of varying  size and scattered throughout both lungs. The distribution and size of these opacities is essentially unchanged from the prior study in June 2016. It is unusual for opacities to remain unchanged for 7 months; malignancy is considered unlikely, and atypical infection relatively unlikely. An inflammatory condition such as sarcoidosis might have such an appearance, though the relative lack of lymphadenopathy suggests against sarcoidosis. No significant pleural effusion or pneumothorax is seen. The heart is mildly enlarged. Trace pericardial fluid remains within normal limits. Visualized mediastinal nodes remain normal in size. The hila are difficult to fully assess due to surrounding opacities. The great vessels are grossly unremarkable in appearance. The visualized portions of the thyroid gland are unremarkable. No axillary lymphadenopathy is seen. The visualized portions of the liver and spleen are unremarkable. The visualized portions of the pancreas, adrenal glands and kidneys are within normal limits. Nonspecific bilateral perinephric stranding is noted. No acute osseous abnormalities are identified. Anterior bridging osteophytes are noted along the mid thoracic spine. A stable focal 1.2 cm lucency is noted along the right side of the manubrium. IMPRESSION: 1. Prominent bilateral nodular airspace opacities, of varying size and scattered throughout both lungs, are essentially unchanged from the prior study in June 2016. It is unusual for opacities to remain entirely unchanged for 7 months. Malignancy is considered unlikely, and atypical infection relatively unlikely. An inflammatory condition such as sarcoidosis might have such an appearance, though the relative lack of adenopathy suggests against sarcoidosis. Pulmonary consultation would be helpful. 2. Mild cardiomegaly noted. 3. Stable focal 1.2 cm lucency along the right side of the manubrium, nonspecific in appearance. These results were called by telephone at  the time of interpretation on 06/08/2015 at 11:48 pm to Dr. Tanna Furry, who verbally acknowledged these results. Electronically Signed   By: Garald Balding M.D.   On: 06/09/2015 00:00    SIGNIFICANT EVENTS  1/24  Admit with SOB, bilateral infiltrates on CXR / CT, wt gain 10lbs, Hgb 7  STUDIES:  1/23  CT Chest >> prominent bilateral nodular airspace opacities of varying size, unchanged from 10/2014, mild cardiomegaly, no lymphadenopathy  1/24  FOBT >> negative   ANTIBIOTICS: Levaquin 1/23 >>  CULTURES:  BCx2 1/23 >>   DISCUSSION: 70 y/o AAF, never smoker, admitted with SOB and unchanged diffuse bilateral nodular opacities.  Concern for findings c/w CHF (wt gain, LE swelling etc) in addition to abnormal CT (unchanged from 2016).     ASSESSMENT / PLAN:  Dyspnea - suspect multifactorial in the setting of morbid obesity, possible decompensation of CHF with hx HTN, LE swelling etc (and responded to diuretics), r/o PAH (?undiagnosed OSA/OHS), anemia and  bilateral pulmonary nodules  Diffuse Bilateral Pulmonary Nodules - unchanged from prior imaging in 2016, concern for autoimmune process vs (less likely) malignancy, doubt infectious etiology as no WBC / fever etc   Plan: Await D-Dimer, doubt PE as pt is not hypoxic (97% on room air, not on O2) Assess ECHO  Assess ambulatory desaturation prior to discharge Intermittent CXR  Diuresis as renal function permits Repeat auto-immune panel  Consider FOB for tissue sampling of nodules >> see below Continue empiric abx for now, low threshold to d/c     Noe Gens, NP-C [facility] Pgr: [phone] or if no answer [phone] 06/09/2015, 8:36 AM   Attending Note:  I have examined patient, reviewed labs, studies and notes. I have discussed the case with B Ollis, and I agree with the data and plans as amended above. Pt with poorly formed nodular infiltrates bilaterally that have not changed since 6/16. Etiology unclear, suspect  an inflammatory process, either auto-immune or smoldering infxn. No compelling exposures in the history to guide to a specific dx. I believe she needs FOB with BAL and bx's to progress the workup. Discussed with her and she agrees, will discuss with her family. I have scheduled for Thursday 1/26 at 10:00   Baltazar Apo, MD, PhD 06/09/2015, 12:37 PM  [facility] Care [phone] or if no answer [phone]

## 2015-06-09 NOTE — ED Notes (Signed)
Son Sheri Pearson made aware of patient's bed assignment

## 2015-06-09 NOTE — ED Notes (Signed)
Occult stool card completed at this time. No frank blood present.

## 2015-06-09 NOTE — Progress Notes (Signed)
Report received from ED RN. Room ready for patient. Starling Christofferson [name], RN 

## 2015-06-09 NOTE — Progress Notes (Signed)
ANTIBIOTIC CONSULT NOTE - INITIAL  Pharmacy Consult for Levaquin  Indication: rule out pneumonia  Allergies  Allergen Reactions  . Pseudoephedrine Hcl Hives    Patient Measurements: Height: 5\' 6"  (167.6 cm) Weight: 283 lb (128.368 kg) IBW/kg (Calculated) : 59.3   Vital Signs: Temp: 98.5 F (36.9 C) (01/24 0327) Temp Source: Oral (01/24 0327) BP: 171/83 mmHg (01/24 0255) Pulse Rate: 71 (01/24 0327)  Labs:  Recent Labs  06/08/15 2002  WBC 7.2  HGB 7.1*  PLT 344  CREATININE 2.78*   Estimated Creatinine Clearance: 26.9 mL/min (by C-G formula based on Cr of 2.78).  Past Medical History  Diagnosis Date  . Diabetes mellitus without complication ([place])   . Hypertension   . Pneumonia      Assessment: 70 y/o F with patchy bilateral airspace disease on CXR, WBC WNL, noted renal dysfunction, starting Levaquin per pharmacy, using Levaquin given possibility of atypical infection.   Plan:  -Levaquin 750 mg IV q48h -F/U infectious work-up  Narda Bonds 06/09/2015,3:58 AM

## 2015-06-09 NOTE — Progress Notes (Signed)
Patient c/o severe leg cramps,per patient,it's from the lasix,and she gets it on and off.On call MD made aware.Order received.Will continue to monitor. Arlayne Liggins, Wonda Cheng, Therapist, sports

## 2015-06-10 DIAGNOSIS — I5031 Acute diastolic (congestive) heart failure: Secondary | ICD-10-CM

## 2015-06-10 DIAGNOSIS — D649 Anemia, unspecified: Secondary | ICD-10-CM

## 2015-06-10 DIAGNOSIS — I10 Essential (primary) hypertension: Secondary | ICD-10-CM

## 2015-06-10 DIAGNOSIS — N184 Chronic kidney disease, stage 4 (severe): Secondary | ICD-10-CM

## 2015-06-10 DIAGNOSIS — E1121 Type 2 diabetes mellitus with diabetic nephropathy: Secondary | ICD-10-CM

## 2015-06-10 DIAGNOSIS — J9601 Acute respiratory failure with hypoxia: Principal | ICD-10-CM

## 2015-06-10 DIAGNOSIS — Z794 Long term (current) use of insulin: Secondary | ICD-10-CM

## 2015-06-10 LAB — ANCA TITERS: C-ANCA: <1:20 {titer}

## 2015-06-10 LAB — C4 COMPLEMENT: COMPLEMENT C4, BODY FLUID: 39 mg/dL (ref 14–44)

## 2015-06-10 LAB — GLUCOSE, CAPILLARY
GLUCOSE-CAPILLARY: 114 mg/dL — AB (ref 65–99)
GLUCOSE-CAPILLARY: 155 mg/dL — AB (ref 65–99)
GLUCOSE-CAPILLARY: 82 mg/dL (ref 65–99)
GLUCOSE-CAPILLARY: 122 mg/dL — AB (ref 65–99)
GLUCOSE-CAPILLARY: 40 mg/dL — AB (ref 65–99)
Glucose-Capillary: 125 mg/dL — ABNORMAL HIGH (ref 65–99)
Glucose-Capillary: 164 mg/dL — ABNORMAL HIGH (ref 65–99)

## 2015-06-10 LAB — ANTINUCLEAR ANTIBODIES, IFA: ANTINUCLEAR ANTIBODIES, IFA: NEGATIVE

## 2015-06-10 LAB — PROCALCITONIN: Procalcitonin: <0.1 ng/mL

## 2015-06-10 LAB — RHEUMATOID FACTOR: Rhuematoid fact SerPl-aCnc: <10 IU/mL (ref 0.0–13.9)

## 2015-06-10 LAB — C3 COMPLEMENT: C3 Complement: 128 mg/dL (ref 82–167)

## 2015-06-10 LAB — ANTI-SCLERODERMA ANTIBODY: Scleroderma (Scl-70) (ENA) Antibody, IgG: <0.2 AI (ref 0.0–0.9)

## 2015-06-10 MED ORDER — FUROSEMIDE 20 MG PO TABS
20.0000 mg | ORAL_TABLET | Freq: Every day | ORAL | Status: DC
  Administered 2015-06-10: 20 mg via ORAL
  Filled 2015-06-10: qty 1

## 2015-06-10 MED ORDER — INSULIN DETEMIR 100 UNIT/ML ~~LOC~~ SOLN
45.0000 [IU] | Freq: Every day | SUBCUTANEOUS | Status: DC
  Filled 2015-06-10: qty 0.45

## 2015-06-10 MED ORDER — CYCLOBENZAPRINE HCL 10 MG PO TABS
10.0000 mg | ORAL_TABLET | Freq: Once | ORAL | Status: AC
  Administered 2015-06-10: 10 mg via ORAL
  Filled 2015-06-10: qty 1

## 2015-06-10 MED ORDER — HYDROCODONE-ACETAMINOPHEN 5-325 MG PO TABS
1.0000 | ORAL_TABLET | Freq: Once | ORAL | Status: AC
  Administered 2015-06-10: 1 via ORAL
  Filled 2015-06-10: qty 1

## 2015-06-10 MED ORDER — INSULIN DETEMIR 100 UNIT/ML ~~LOC~~ SOLN
35.0000 [IU] | Freq: Every day | SUBCUTANEOUS | Status: DC
  Filled 2015-06-10: qty 0.35

## 2015-06-10 MED ORDER — INSULIN ASPART 100 UNIT/ML ~~LOC~~ SOLN: 6.0000 [IU] | Freq: Three times a day (TID) | SUBCUTANEOUS | Status: DC

## 2015-06-10 MED ORDER — INSULIN ASPART 100 UNIT/ML ~~LOC~~ SOLN: 12.0000 [IU] | Freq: Three times a day (TID) | SUBCUTANEOUS | Status: DC

## 2015-06-10 NOTE — Progress Notes (Signed)
Planned bronchoscopy for am 1/26 at 10AM.  FOB needs arranged.     Plan: NPO after MN  Pre-Bronch orders placed PCCM will follow up after FOB   Noe Gens, NP-C [facility] Pgr: [phone] or if no answer [phone] 06/10/2015, 8:43 AM

## 2015-06-10 NOTE — Progress Notes (Signed)
Addendum  RN page MD re hypoglycemic episode with CBG 40 at 5:51 PM. Noted recurrent hypoglycemia, despite not getting Levemir last night. DC Levemir and mealtime Novolog for now and continue SSI only. This may be d/t renal insufficiency and poor oral intake in hospital. Monitor closely. Discussed with patient and Sister-In-Law via phone, update care and answered questions.  Vernell Leep, MD, FACP, FHM. [facility] Pager [phone]  If 7PM-7AM, please contact night-coverage [email or web address] Password [email or web address] 06/10/2015, 7:20 PM

## 2015-06-10 NOTE — Progress Notes (Signed)
Hypoglycemic Event  CBG: 40  Treatment: 15 GM carbohydrate snack  Symptoms: Shaky  Follow-up CBG: Time:1820 CBG Result:122  Possible Reasons for Event: Unknown  Comments/MD notified:Dr. Hongalgi notified. Will continue to monitor    [employer]

## 2015-06-10 NOTE — Progress Notes (Addendum)
PROGRESS NOTE    Sheri Pearson [id number] DOB: [birth year] DOA: 06/08/2015 PCP: Arcelia Jew, FNP  Primary Nephrologist: Dr. Erling Cruz.  HPI/Brief narrative 70 year old female patient with history of stage IV chronic kidney disease, DM 2, HTN, HLD, chronic anemia presented to the [facility] ED on 06/09/15 for worsening dyspnea of 2 weeks' duration. She was seen by PCP 2 days prior to admission and treated with Lasix for some lower extremity edema. No change in dyspnea despite Lasix. CT chest showed bilateral nodular opacities. Pulmonology was consulted and plan FOB on 1/26. Hemoglobin 7.1 on admission. FOBT negative.   Assessment/Plan:   1. Acute respiratory failure with hypoxia: Probably multifactorial: Morbid obesity, possible decompensated CHF in the context of long-standing hypertension and lower extremity edema with response to diuretics inpatient, rule out PAH (? Undiagnosed OSA/OHS), anemia and bilateral pulmonary nodules. CT chest without contrast shows bilateral pulmonary nodules but stable since 2016 June. Etiology of pulmonary nodules not clear but suspect an inflammatory process, either autoimmune or smoldering infection. Pulmonology consultation appreciated and plan for FOB on 1/26 at 10 AM. Remains on empiric IV levofloxacin. Diuresed with 2 doses of Lasix on 1/24. 2. Possible acute diastolic CHF: Treated with Lasix with good effect. -690 mL since admission. Continue low-dose oral Lasix. 3. Type II DM: Did have a hypoglycemic range CBG/57 last night. We will reduce Levemir & mealtime NovoLog dose slightly tonight. Monitor closely. 4. Stage IV chronic kidney disease: Follows with Dr. Florene Glen, Nephrology. Baseline creatinine is not known. Creatinine on 11/13/14:4.32. Presented this admission with creatinine of 2.78. Creatinine is slightly improved to 2.63. 5. Essential hypertension: Mildly uncontrolled. Continue home dose of carvedilol, verapamil and hydralazine 6. Anemia in chronic  kidney disease: Stable 7. Hypothyroid: Continue Synthroid 8. Hyperlipidemia: Continue statins 9. Morbid obesity/Body mass index is 44.86 kg/(m^2).    DVT prophylaxis: Subcutaneous heparin Code Status: Full Family Communication: None at bedside Disposition Plan: DC home when medically stable   Consultants:  Pulmonology  Procedures:  2-D echo: Study Conclusions  - Left ventricle: The cavity size was normal. Wall thickness was increased in a pattern of mild LVH. Systolic function was normal. The estimated ejection fraction was in the range of 60% to 65%. Wall motion was normal; there were no regional wall motion abnormalities. Doppler parameters are consistent with abnormal left ventricular relaxation (grade 1 diastolic dysfunction). - Aortic valve: There was no stenosis. There was trivial regurgitation. - Mitral valve: There was trivial regurgitation. - Left atrium: The atrium was moderately dilated. - Right ventricle: The cavity size was normal. Systolic function was normal. - Atrial septum: Cannot rule out small PFO. - Pulmonary arteries: No complete TR doppler jet so unable to estimate PA systolic pressure. - Systemic veins: IVC measured 2.3 cm with < 50% respirophasic variation, suggesting RA pressure 15 mmHg.  Impressions:  - Normal LV size with mild LV hypertrophy. EF 60-65%. Normal RV size and systolic function. Moderate LAE. No significant valvular abnormalities. IVC dilated, suggestive of elevated RV filling pressure.  Antimicrobials:  IV levofloxacin   Subjective: Feels much better. Dyspnea has significantly improved. States that she has ambulated in the room/to the bathroom without dyspnea. No chest pain reported. Good urine output overnight.  Objective: Filed Vitals:   06/09/15 1256 06/09/15 1631 06/09/15 2100 06/10/15 0500  BP: 158/41  143/54 163/45  Pulse: 69 71 70 62  Temp:  98.6 F (37 C) 99.6 F (37.6 C) 98.3 F (36.8  C)  TempSrc:  Oral Oral Oral  Resp:   18 16  Height:      Weight:      SpO2: 98% 99% 98% 98%    Intake/Output Summary (Last 24 hours) at 06/10/15 1551 Last data filed at 06/10/15 1400  Gross per 24 hour  Intake    660 ml  Output   1400 ml  Net   -740 ml   Filed Weights   06/08/15 1932 06/09/15 0327  Weight: 128.368 kg (283 lb) 126.009 kg (277 lb 12.8 oz)    Exam:  General exam: Morbidly obese, moderately built pleasant middle-aged female lying comfortably supine in bed. Respiratory system: Clear. No increased work of breathing. Cardiovascular system: S1 & S2 heard, RRR. No JVD, murmurs, gallops, clicks or pedal edema. Gastrointestinal system: Abdomen is nondistended, soft and nontender. Normal bowel sounds heard. Central nervous system: Alert and oriented. No focal neurological deficits. Extremities: Symmetric 5 x 5 power.   Data Reviewed: Basic Metabolic Panel:  Recent Labs Lab 06/08/15 2002 06/09/15 0847  NA 140 140  K 4.9 4.8  CL 109 111  CO2 24 21*  GLUCOSE 117* 127*  BUN 40* 39*  CREATININE 2.78* 2.63*  CALCIUM 9.0 8.8*   Liver Function Tests:  Recent Labs Lab 06/08/15 2002  AST 16  ALT 11*  ALKPHOS 51  BILITOT 0.4  PROT 9.9*  ALBUMIN 2.9*   No results for input(s): LIPASE, AMYLASE in the last 168 hours. No results for input(s): AMMONIA in the last 168 hours. CBC:  Recent Labs Lab 06/08/15 2002 06/09/15 0847  WBC 7.2 7.3  NEUTROABS 4.9  --   HGB 7.1* 7.5*  HCT 24.4* 25.0*  MCV 87.8 88.3  PLT 344 309   Cardiac Enzymes:  Recent Labs Lab 06/09/15 0847  TROPONINI <0.03   BNP (last 3 results) No results for input(s): PROBNP in the last 8760 hours. CBG:  Recent Labs Lab 06/09/15 2128 06/09/15 2209 06/10/15 0504 06/10/15 0737 06/10/15 1137  GLUCAP 57* 98 125* 114* 155*    Recent Results (from the past 240 hour(s))  Culture, blood (Routine X 2) w Reflex to ID Panel     Status: None (Preliminary result)   Collection Time:  06/08/15 10:30 PM  Result Value Ref Range Status   Specimen Description BLOOD RIGHT ARM  Final   Special Requests BOTTLES DRAWN AEROBIC AND ANAEROBIC 4ML  Final   Culture NO GROWTH 1 DAY  Final   Report Status PENDING  Incomplete  Culture, blood (Routine X 2) w Reflex to ID Panel     Status: None (Preliminary result)   Collection Time: 06/08/15 10:51 PM  Result Value Ref Range Status   Specimen Description BLOOD RIGHT ARM  Final   Special Requests BOTTLES DRAWN AEROBIC AND ANAEROBIC 5ML  Final   Culture NO GROWTH 1 DAY  Final   Report Status PENDING  Incomplete         Studies: Dg Chest 2 View  06/08/2015  CLINICAL DATA:  Shortness of breath, fatigue in fluid retention in both legs in the chest. Initial encounter. EXAM: CHEST  2 VIEW COMPARISON:  PA and lateral chest 11/10/2014 and 11/25/2014. CT chest 11/08/2014. FINDINGS: There is patchy airspace disease in the lingula and right lower lobe. The appearance is not grossly changed compared to the most recent examination. There is cardiomegaly without plain film evidence of pulmonary edema. No pneumothorax or pleural effusion. IMPRESSION: Patchy bilateral airspace disease appears unchanged compared to the most recent study most compatible with pneumonia, possibly atypical.  The appearance is not suggestive of pulmonary edema. Electronically Signed   By: Inge Rise M.D.   On: 06/08/2015 20:08   Ct Chest Wo Contrast  06/09/2015  CLINICAL DATA:  Subacute onset of shortness of breath and dyspnea on exertion. Initial encounter. EXAM: CT CHEST WITHOUT CONTRAST TECHNIQUE: Multidetector CT imaging of the chest was performed following the standard protocol without IV contrast. COMPARISON:  Chest radiograph performed earlier today at 7:57 p.m., and CT of the chest performed 11/08/2014 FINDINGS: Prominent bilateral nodular airspace opacities are seen, of varying size and scattered throughout both lungs. The distribution and size of these opacities is  essentially unchanged from the prior study in June 2016. It is unusual for opacities to remain unchanged for 7 months; malignancy is considered unlikely, and atypical infection relatively unlikely. An inflammatory condition such as sarcoidosis might have such an appearance, though the relative lack of lymphadenopathy suggests against sarcoidosis. No significant pleural effusion or pneumothorax is seen. The heart is mildly enlarged. Trace pericardial fluid remains within normal limits. Visualized mediastinal nodes remain normal in size. The hila are difficult to fully assess due to surrounding opacities. The great vessels are grossly unremarkable in appearance. The visualized portions of the thyroid gland are unremarkable. No axillary lymphadenopathy is seen. The visualized portions of the liver and spleen are unremarkable. The visualized portions of the pancreas, adrenal glands and kidneys are within normal limits. Nonspecific bilateral perinephric stranding is noted. No acute osseous abnormalities are identified. Anterior bridging osteophytes are noted along the mid thoracic spine. A stable focal 1.2 cm lucency is noted along the right side of the manubrium. IMPRESSION: 1. Prominent bilateral nodular airspace opacities, of varying size and scattered throughout both lungs, are essentially unchanged from the prior study in June 2016. It is unusual for opacities to remain entirely unchanged for 7 months. Malignancy is considered unlikely, and atypical infection relatively unlikely. An inflammatory condition such as sarcoidosis might have such an appearance, though the relative lack of adenopathy suggests against sarcoidosis. Pulmonary consultation would be helpful. 2. Mild cardiomegaly noted. 3. Stable focal 1.2 cm lucency along the right side of the manubrium, nonspecific in appearance. These results were called by telephone at the time of interpretation on 06/08/2015 at 11:48 pm to Dr. Tanna Furry, who verbally  acknowledged these results. Electronically Signed   By: Garald Balding M.D.   On: 06/09/2015 00:00        Scheduled Meds: . carvedilol  25 mg Oral BID WC  . ferrous sulfate  325 mg Oral BID WC  . heparin  5,000 Units Subcutaneous 3 times per day  . hydrALAZINE  25 mg Oral 3 times per day  . insulin aspart  0-9 Units Subcutaneous TID WC  . insulin aspart  15 Units Subcutaneous TID WC  . insulin detemir  50 Units Subcutaneous QHS  . levofloxacin (LEVAQUIN) IV  750 mg Intravenous Q48H  . levothyroxine  112 mcg Oral QAC breakfast  . omega-3 acid ethyl esters  1 g Oral Daily  . rosuvastatin  10 mg Oral QHS  . verapamil  120 mg Oral BID  . vitamin B-12  2,500 mcg Oral Q1200   Continuous Infusions:   Principal Problem:   Acute respiratory failure with hypoxia (HCC) Active Problems:   Essential hypertension   Pulmonary infiltrates on CXR   DM (diabetes mellitus), type 2 with renal complications (HCC)   Chronic anemia   CKD (chronic kidney disease) stage 4, GFR 15-29 ml/min (HCC)  Pulmonary nodules/lesions, multiple    Time spent: 40 minutes.    Vernell Leep, MD, FACP, FHM. [facility] Pager [phone]  If 7PM-7AM, please contact night-coverage [email or web address] Password [email or web address] 06/10/2015, 3:51 PM    LOS: 1 day

## 2015-06-10 NOTE — Progress Notes (Signed)
Utilization review complete. Cyndi Montejano RN CCM Case Mgmt phone [phone] 

## 2015-06-11 ENCOUNTER — Encounter (HOSPITAL_COMMUNITY)
Admission: EM | Disposition: A | Discharge status: Home / Self Care | Payer: Self-pay | Source: Home / Self Care | Attending: Internal Medicine

## 2015-06-11 ENCOUNTER — Inpatient Hospital Stay (HOSPITAL_COMMUNITY): Payer: Medicare Other

## 2015-06-11 DIAGNOSIS — E162 Hypoglycemia, unspecified: Secondary | ICD-10-CM

## 2015-06-11 HISTORY — PX: VIDEO BRONCHOSCOPY: SHX5072

## 2015-06-11 LAB — GLUCOSE, CAPILLARY
GLUCOSE-CAPILLARY: 109 mg/dL — AB (ref 65–99)
GLUCOSE-CAPILLARY: 175 mg/dL — AB (ref 65–99)
GLUCOSE-CAPILLARY: 251 mg/dL — AB (ref 65–99)
GLUCOSE-CAPILLARY: 143 mg/dL — AB (ref 65–99)
Glucose-Capillary: 186 mg/dL — ABNORMAL HIGH (ref 65–99)

## 2015-06-11 SURGERY — BRONCHOSCOPY, WITH FLUOROSCOPY
Anesthesia: Moderate Sedation | Laterality: Bilateral

## 2015-06-11 MED ORDER — MIDAZOLAM HCL 5 MG/ML IJ SOLN
INTRAMUSCULAR | Status: AC
  Filled 2015-06-11: qty 2

## 2015-06-11 MED ORDER — FENTANYL CITRATE (PF) 100 MCG/2ML IJ SOLN
INTRAMUSCULAR | Status: AC
  Filled 2015-06-11: qty 4

## 2015-06-11 MED ORDER — FENTANYL CITRATE (PF) 100 MCG/2ML IJ SOLN
INTRAMUSCULAR | Status: DC | PRN
  Administered 2015-06-11: 50 ug via INTRAVENOUS
  Administered 2015-06-11 (×2): 25 ug via INTRAVENOUS
  Administered 2015-06-11: 50 ug via INTRAVENOUS

## 2015-06-11 MED ORDER — ZOLPIDEM TARTRATE 5 MG PO TABS
5.0000 mg | ORAL_TABLET | Freq: Once | ORAL | Status: AC
  Administered 2015-06-11: 5 mg via ORAL
  Filled 2015-06-11: qty 1

## 2015-06-11 MED ORDER — LIDOCAINE HCL (PF) 1 % IJ SOLN
INTRAMUSCULAR | Status: DC | PRN
  Administered 2015-06-11: 6 mL

## 2015-06-11 MED ORDER — SODIUM CHLORIDE 0.9 % IV SOLN
INTRAVENOUS | Status: DC
  Administered 2015-06-11: 10:00:00 via INTRAVENOUS

## 2015-06-11 MED ORDER — LIDOCAINE HCL 2 % EX GEL
CUTANEOUS | Status: DC | PRN
  Administered 2015-06-11: 1

## 2015-06-11 MED ORDER — MIDAZOLAM HCL 10 MG/2ML IJ SOLN
INTRAMUSCULAR | Status: DC | PRN
  Administered 2015-06-11: 3 mg via INTRAVENOUS
  Administered 2015-06-11 (×2): 2 mg via INTRAVENOUS

## 2015-06-11 NOTE — Progress Notes (Signed)
Video Bronchoscopy done  Intervention Bronchial washing done Intervention Bronchial biopsy done Intervention Bronchial brushing done   Baltazar Apo, MD, PhD 06/12/2015, 9:52 AM [facility] [phone] or if no answer [phone]

## 2015-06-11 NOTE — Progress Notes (Signed)
PROGRESS NOTE    Sheri Pearson [id number] DOB: 09-10-1945 DOA: 06/08/2015 PCP: Arcelia Jew, FNP  Primary Nephrologist: Dr. Erling Cruz.  HPI/Brief narrative 70 year old female patient with history of stage IV chronic kidney disease, DM 2, HTN, HLD, chronic anemia presented to the [facility] ED on 06/09/15 for worsening dyspnea of 2 weeks' duration. She was seen by PCP 2 days prior to admission and treated with Lasix for some lower extremity edema. No change in dyspnea despite Lasix. CT chest showed bilateral nodular opacities. Pulmonology was consulted and s/p FOB on 1/26. Hemoglobin 7.1 on admission. FOBT negative.   Assessment/Plan:   1. Acute respiratory failure with hypoxia: Probably multifactorial: Morbid obesity, possible decompensated CHF in the context of long-standing hypertension and lower extremity edema with response to diuretics inpatient, rule out PAH (? Undiagnosed OSA/OHS), anemia and bilateral pulmonary nodules. CT chest without contrast shows bilateral pulmonary nodules but stable since 2016 June. Etiology of pulmonary nodules not clear but suspect an inflammatory process, either autoimmune or smoldering infection. Pulmonology follow-up appreciated and s/p FOB on 1/26. Remains on empiric IV levofloxacin. Diuresed with 2 doses of Lasix on 1/24. 2. Possible acute diastolic CHF: Treated with Lasix with good effect. -1.856 mL since admission. Continue low-dose oral Lasix. 3. Type II DM: Patient had recurrent hypoglycemia on 1/24 and 1/25. This despite not having received her Levemir since admission. Thereby discontinued Levemir and mealtime NovoLog and placed only on NovoLog SSI. No hypoglycemia. CBGs mildly uncontrolled and fluctuating. Monitor closely. 4. Stage IV chronic kidney disease: Follows with Dr. Florene Glen, Nephrology. Baseline creatinine is not known. Creatinine on 11/13/14:4.32. Presented this admission with creatinine of 2.78. Creatinine is slightly improved to 2.63.  Follow BMP in a.m. 5. Essential hypertension: Mildly uncontrolled. Continue home dose of carvedilol, verapamil and hydralazine 6. Anemia in chronic kidney disease: Stable 7. Hypothyroid: Continue Synthroid 8. Hyperlipidemia: Continue statins 9. Morbid obesity/Body mass index is 45.6 kg/(m^2).    DVT prophylaxis: Subcutaneous heparin Code Status: Full Family Communication: Discussed with patient's sister-in-law at bedside on 1/26 Disposition Plan: DC home when medically stable   Consultants:  Pulmonology  Procedures:  Fiberoptic bronchoscopy 06/10/14  2-D echo: Study Conclusions  - Left ventricle: The cavity size was normal. Wall thickness was increased in a pattern of mild LVH. Systolic function was normal. The estimated ejection fraction was in the range of 60% to 65%. Wall motion was normal; there were no regional wall motion abnormalities. Doppler parameters are consistent with abnormal left ventricular relaxation (grade 1 diastolic dysfunction). - Aortic valve: There was no stenosis. There was trivial regurgitation. - Mitral valve: There was trivial regurgitation. - Left atrium: The atrium was moderately dilated. - Right ventricle: The cavity size was normal. Systolic function was normal. - Atrial septum: Cannot rule out small PFO. - Pulmonary arteries: No complete TR doppler jet so unable to estimate PA systolic pressure. - Systemic veins: IVC measured 2.3 cm with < 50% respirophasic variation, suggesting RA pressure 15 mmHg.  Impressions:  - Normal LV size with mild LV hypertrophy. EF 60-65%. Normal RV size and systolic function. Moderate LAE. No significant valvular abnormalities. IVC dilated, suggestive of elevated RV filling pressure.  Antimicrobials:  IV levofloxacin   Subjective: Patient was seen this morning prior to FOB. Denied dyspnea or complaints.  Objective: Filed Vitals:   06/11/15 1115 06/11/15 1120 06/11/15 1125  06/11/15 1226  BP:  183/58 170/58 165/54  Pulse: 65 60 59 63  Temp:    97.3 F (36.3 C)  TempSrc:    Oral  Resp: 20 18 19 20   Height:      Weight:      SpO2: 93% 96% 98% 98%    Intake/Output Summary (Last 24 hours) at 06/11/15 1635 Last data filed at 06/11/15 0900  Gross per 24 hour  Intake    240 ml  Output   1250 ml  Net  -1010 ml   Filed Weights   06/08/15 1932 06/09/15 0327 06/10/15 2014  Weight: 128.368 kg (283 lb) 126.009 kg (277 lb 12.8 oz) 128.1 kg (282 lb 6.6 oz)    Exam:  General exam: Morbidly obese, moderately built pleasant middle-aged female sitting up comfortably at edge of bed this morning. Respiratory system: Clear. No increased work of breathing. Cardiovascular system: S1 & S2 heard, RRR. No JVD, murmurs, gallops, clicks or pedal edema. Gastrointestinal system: Abdomen is nondistended, soft and nontender. Normal bowel sounds heard. Central nervous system: Alert and oriented. No focal neurological deficits. Extremities: Symmetric 5 x 5 power.   Data Reviewed: Basic Metabolic Panel:  Recent Labs Lab 06/08/15 2002 06/09/15 0847  NA 140 140  K 4.9 4.8  CL 109 111  CO2 24 21*  GLUCOSE 117* 127*  BUN 40* 39*  CREATININE 2.78* 2.63*  CALCIUM 9.0 8.8*   Liver Function Tests:  Recent Labs Lab 06/08/15 2002  AST 16  ALT 11*  ALKPHOS 51  BILITOT 0.4  PROT 9.9*  ALBUMIN 2.9*   No results for input(s): LIPASE, AMYLASE in the last 168 hours. No results for input(s): AMMONIA in the last 168 hours. CBC:  Recent Labs Lab 06/08/15 2002 06/09/15 0847  WBC 7.2 7.3  NEUTROABS 4.9  --   HGB 7.1* 7.5*  HCT 24.4* 25.0*  MCV 87.8 88.3  PLT 344 309   Cardiac Enzymes:  Recent Labs Lab 06/09/15 0847  TROPONINI <0.03   BNP (last 3 results) No results for input(s): PROBNP in the last 8760 hours. CBG:  Recent Labs Lab 06/10/15 1820 06/10/15 2012 06/11/15 0736 06/11/15 1122 06/11/15 1221  GLUCAP 122* 164* 109* 175* 186*    Recent  Results (from the past 240 hour(s))  Culture, blood (Routine X 2) w Reflex to ID Panel     Status: None (Preliminary result)   Collection Time: 06/08/15 10:30 PM  Result Value Ref Range Status   Specimen Description BLOOD RIGHT ARM  Final   Special Requests BOTTLES DRAWN AEROBIC AND ANAEROBIC 4ML  Final   Culture NO GROWTH 2 DAYS  Final   Report Status PENDING  Incomplete  Culture, blood (Routine X 2) w Reflex to ID Panel     Status: None (Preliminary result)   Collection Time: 06/08/15 10:51 PM  Result Value Ref Range Status   Specimen Description BLOOD RIGHT ARM  Final   Special Requests BOTTLES DRAWN AEROBIC AND ANAEROBIC 5ML  Final   Culture NO GROWTH 2 DAYS  Final   Report Status PENDING  Incomplete         Studies: Dg Chest Port 1 View  06/11/2015  CLINICAL DATA:  Status post bronchoscopy with biopsy. EXAM: PORTABLE CHEST 1 VIEW COMPARISON:  Chest x-ray and chest CT scan of June 08, 2015 FINDINGS: The lungs are slightly less well inflated today. There is no pneumothorax, pneumomediastinum, or pleural effusion. The pulmonary interstitial markings remain increased. The cardiac silhouette is top-normal to mildly enlarged but stable. The pulmonary vascularity is mildly engorged. The mediastinum is normal in width. IMPRESSION: There is no postprocedure complication  following bronchoscopy. There are persistent patchy interstitial and alveolar airspace opacities bilaterally. Stable mild cardiomegaly with mild central pulmonary vascular prominence. Electronically Signed   By: David  Martinique M.D.   On: 06/11/2015 11:28   Dg C-arm Bronchoscopy  06/11/2015  CLINICAL DATA:  C-ARM BRONCHOSCOPY Fluoroscopy was utilized by the requesting physician.  No radiographic interpretation.        Scheduled Meds: . carvedilol  25 mg Oral BID WC  . ferrous sulfate  325 mg Oral BID WC  . furosemide  20 mg Oral Daily  . heparin  5,000 Units Subcutaneous 3 times per day  . hydrALAZINE  25 mg Oral 3  times per day  . insulin aspart  0-9 Units Subcutaneous TID WC  . levofloxacin (LEVAQUIN) IV  750 mg Intravenous Q48H  . levothyroxine  112 mcg Oral QAC breakfast  . omega-3 acid ethyl esters  1 g Oral Daily  . rosuvastatin  10 mg Oral QHS  . verapamil  120 mg Oral BID  . vitamin B-12  2,500 mcg Oral Q1200   Continuous Infusions: . sodium chloride 10 mL/hr at 06/11/15 1017    Principal Problem:   Acute respiratory failure with hypoxia (HCC) Active Problems:   Essential hypertension   Pulmonary infiltrates on CXR   DM (diabetes mellitus), type 2 with renal complications (HCC)   Chronic anemia   CKD (chronic kidney disease) stage 4, GFR 15-29 ml/min (HCC)   Pulmonary nodules/lesions, multiple    Time spent: 20 minutes.    Vernell Leep, MD, FACP, FHM. [facility] Pager [phone]  If 7PM-7AM, please contact night-coverage [email or web address] Password [facility] 06/11/2015, 4:35 PM    LOS: 2 days
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
## 2015-06-11 NOTE — Progress Notes (Signed)
Pharmacy Antibiotic Follow-up Note  Sheri Pearson is a 70 y.o. year-old female admitted on 06/08/2015.  The patient is currently on day 3 of Levaquin for pulmonary infection .  Assessment/Plan: 70 y/o F with patchy bilateral airspace disease on CXR, WBC WNL, noted renal dysfunction, starting Levaquin per pharmacy, using Levaquin given possibility of atypical infection. Bronch am 1/26 at 10 am. CCM 1/24 " continue empiric abx for now, low threshold to d/c." Afebrile, WBC 7.3. CKD. Levaquin dose adjusted appropriately.  Pharmacy will sign off. Please reconsult for further dosing assitance.  Guiselle Mian S. Alford Highland, PharmD, BCPS Clinical Staff Pharmacist Pager [phone]    Temp (24hrs), Avg:98.2 F (36.8 C), Min:97.3 F (36.3 C), Max:98.6 F (37 C)   Recent Labs Lab 06/08/15 2002 06/09/15 0847  WBC 7.2 7.3    Recent Labs Lab 06/08/15 2002 06/09/15 0847  CREATININE 2.78* 2.63*   Estimated Creatinine Clearance: 28.4 mL/min (by C-G formula based on Cr of 2.63).    Allergies  Allergen Reactions  . Pseudoephedrine Hcl Hives    Antimicrobials this admission: Levaquin 1/24>>  Microbiology results: 1/23 BCx x2>> ip  Thank you for allowing pharmacy to be a part of this patient's care.  Wayland Salinas PharmD 06/11/2015 2:36 PM

## 2015-06-12 ENCOUNTER — Encounter (HOSPITAL_COMMUNITY): Payer: Self-pay | Admitting: Emergency Medicine

## 2015-06-12 DIAGNOSIS — N179 Acute kidney failure, unspecified: Secondary | ICD-10-CM

## 2015-06-12 LAB — CBC
HEMATOCRIT: 24 % — AB (ref 36.0–46.0)
HEMOGLOBIN: 7.2 g/dL — AB (ref 12.0–15.0)
MCH: 26.3 pg (ref 26.0–34.0)
MCHC: 30 g/dL (ref 30.0–36.0)
MCV: 87.6 fL (ref 78.0–100.0)
Platelets: 313 10*3/uL (ref 150–400)
RBC: 2.74 MIL/uL — AB (ref 3.87–5.11)
RDW: 16.1 % — ABNORMAL HIGH (ref 11.5–15.5)
WBC: 7.3 10*3/uL (ref 4.0–10.5)

## 2015-06-12 LAB — GLUCOSE, CAPILLARY
GLUCOSE-CAPILLARY: 134 mg/dL — AB (ref 65–99)
GLUCOSE-CAPILLARY: 184 mg/dL — AB (ref 65–99)
GLUCOSE-CAPILLARY: 134 mg/dL — AB (ref 65–99)
GLUCOSE-CAPILLARY: 125 mg/dL — AB (ref 65–99)

## 2015-06-12 LAB — PROCALCITONIN

## 2015-06-12 LAB — BASIC METABOLIC PANEL
ANION GAP: 7 (ref 5–15)
BUN: 44 mg/dL — ABNORMAL HIGH (ref 6–20)
CHLORIDE: 111 mmol/L (ref 101–111)
CO2: 21 mmol/L — AB (ref 22–32)
Calcium: 8.4 mg/dL — ABNORMAL LOW (ref 8.9–10.3)
Creatinine, Ser: 3.02 mg/dL — ABNORMAL HIGH (ref 0.44–1.00)
GFR calc non Af Amer: 15 mL/min — ABNORMAL LOW (ref >60)
GFR, EST AFRICAN AMERICAN: 17 mL/min — AB (ref >60)
Glucose, Bld: 129 mg/dL — ABNORMAL HIGH (ref 65–99)
POTASSIUM: 4.9 mmol/L (ref 3.5–5.1)
SODIUM: 139 mmol/L (ref 135–145)

## 2015-06-12 MED ORDER — DARBEPOETIN ALFA 60 MCG/0.3ML IJ SOSY
50.0000 ug | PREFILLED_SYRINGE | INTRAMUSCULAR | Status: DC
  Administered 2015-06-12: 50 ug via SUBCUTANEOUS
  Filled 2015-06-12: qty 0.3

## 2015-06-12 MED ORDER — DARBEPOETIN ALFA 100 MCG/0.5ML IJ SOSY: 100.0000 ug | PREFILLED_SYRINGE | INTRAMUSCULAR | Status: DC

## 2015-06-12 MED ORDER — FERUMOXYTOL INJECTION 510 MG/17 ML
510.0000 mg | INTRAVENOUS | Status: DC
  Administered 2015-06-12: 510 mg via INTRAVENOUS
  Filled 2015-06-12: qty 17

## 2015-06-12 NOTE — Consult Note (Signed)
Reason for Consult:  AKI/CKD Referring Physician: Algis Liming, MD  Sheri Pearson is an 70 y.o. female.  HPI: Pt is a 84yo AAF with PMH sig for poorly controlled HTN, DM, h/o AKI in setting of pneumonia with peak Scr of >7 in April 2016 with subsequent CKD stage 4 with baseline Scr of 2.6-2.8.  She is followed in our office by Dr. Florene Glen and her most recent Scr was 2.83 in October 2016.  Her workup revealed negative ANA, HIV, anti-GBM, ANCA.  No renal biopsy was performed.  She presented to [facility] with 2 day h/o worsening SOB and was found to have diffuse bilateral pulmonary nodules (unchanged from prior imaging in June 2016) and admitted for further evaluation and management.  PCCM was consulted for her dyspnea and lung findings and bronchoscopy was performed on 06/11/15 with brushings sent.  They felt that her dyspnea was multifactorial:  morbid obesity, possible decompensation of CHF, pulmonary nodules, and anemia.  She was started on Lasix with good diuresis, however her Scr rose from 2.63 to 3.02 and we were asked to help further evaluate and manage her AKI/CKD.  The trend in Scr is seen below.    CREATININE, SER  Date/Time Value Ref Range Status  06/12/2015 05:15 AM 3.02* 0.44 - 1.00 mg/dL Final  06/09/2015 08:47 AM 2.63* 0.44 - 1.00 mg/dL Final  06/08/2015 08:02 PM 2.78* 0.44 - 1.00 mg/dL Final  11/13/2014 05:40 AM 4.32* 0.44 - 1.00 mg/dL Final  11/12/2014 05:35 AM 5.33* 0.44 - 1.00 mg/dL Final  11/11/2014 03:43 AM 5.91* 0.44 - 1.00 mg/dL Final  11/10/2014 02:45 AM 7.61* 0.44 - 1.00 mg/dL Final  11/09/2014 02:30 AM 7.29* 0.44 - 1.00 mg/dL Final  11/08/2014 07:27 PM 6.91* 0.44 - 1.00 mg/dL Final    PMH:   Past Medical History  Diagnosis Date  . Diabetes mellitus without complication ([place])   . Hypertension   . Pneumonia     PSH:   Past Surgical History  Procedure Laterality Date  . Video bronchoscopy Bilateral 06/11/2015    Procedure: VIDEO BRONCHOSCOPY WITH FLUORO;  Surgeon: Collene Gobble, MD;  Location: [place];  Service: Cardiopulmonary;  Laterality: Bilateral;    Allergies:  Allergies  Allergen Reactions  . Pseudoephedrine Hcl Hives    Medications:   Prior to Admission medications   Medication Sig Start Date End Date Taking? Authorizing Provider  acetaminophen (TYLENOL) 650 MG CR tablet Take 650 mg by mouth every 8 (eight) hours as needed for pain.   Yes Historical Provider, MD  aspirin EC 81 MG tablet Take 81 mg by mouth at bedtime.   Yes Historical Provider, MD  carbamide peroxide (DEBROX) 6.5 % otic solution Place 1 drop into both ears daily as needed (ear wax).   Yes Historical Provider, MD  carvedilol (COREG) 25 MG tablet Take 25 mg by mouth 2 (two) times daily with a meal.   Yes Historical Provider, MD  Cyanocobalamin (B-12) 2500 MCG TABS Take 2,500 mcg by mouth daily at 12 noon.   Yes Historical Provider, MD  ferrous sulfate 325 (65 FE) MG EC tablet Take 325 mg by mouth 2 (two) times daily.   Yes Historical Provider, MD  furosemide (LASIX) 20 MG tablet Take 20 mg by mouth daily.   Yes Historical Provider, MD  hydrALAZINE (APRESOLINE) 25 MG tablet Take 25 mg by mouth 3 (three) times daily. 03/17/15  Yes Historical Provider, MD  insulin aspart (NOVOLOG) 100 UNIT/ML injection Inject 15 Units into the skin 3 (  three) times daily with meals.    Yes Historical Provider, MD  insulin detemir (LEVEMIR) 100 UNIT/ML injection Inject 50 Units into the skin at bedtime.    Yes Historical Provider, MD  levothyroxine (SYNTHROID, LEVOTHROID) 112 MCG tablet Take 112 mcg by mouth daily before breakfast.   Yes Historical Provider, MD  meclizine (ANTIVERT) 25 MG tablet Take 25 mg by mouth 3 (three) times daily as needed for dizziness.   Yes Historical Provider, MD  Omega-3 Fatty Acids (FISH OIL PO) Take 1 capsule by mouth daily at 12 noon.   Yes Historical Provider, MD  rosuvastatin (CRESTOR) 10 MG tablet Take 10 mg by mouth at bedtime.   Yes Historical Provider, MD  verapamil  (VERELAN PM) 120 MG 24 hr capsule Take 120 mg by mouth 2 (two) times daily.   Yes Historical Provider, MD    Inpatient medications: . carvedilol  25 mg Oral BID WC  . ferrous sulfate  325 mg Oral BID WC  . heparin  5,000 Units Subcutaneous 3 times per day  . hydrALAZINE  25 mg Oral 3 times per day  . insulin aspart  0-9 Units Subcutaneous TID WC  . levofloxacin (LEVAQUIN) IV  750 mg Intravenous Q48H  . levothyroxine  112 mcg Oral QAC breakfast  . omega-3 acid ethyl esters  1 g Oral Daily  . rosuvastatin  10 mg Oral QHS  . verapamil  120 mg Oral BID  . vitamin B-12  2,500 mcg Oral Q1200    Discontinued Meds:   Medications Discontinued During This Encounter  Medication Reason  . verapamil (CALAN) 123456 MG tablet Duplicate  . carvedilol (COREG) 12.5 MG tablet Dose change  . levothyroxine (SYNTHROID, LEVOTHROID) 100 MCG tablet Dose change  . docusate sodium (COLACE) 100 MG capsule Not available  . Cyanocobalamin (VITAMIN B-12) 2000 MCG TBCR Dose change  . furosemide (LASIX) tablet 20 mg   . acetaminophen (TYLENOL) suppository 650 mg   . ondansetron (ZOFRAN) injection 4 mg   . insulin aspart (novoLOG) injection 15 Units   . insulin detemir (LEVEMIR) injection 50 Units   . insulin detemir (LEVEMIR) injection 45 Units   . insulin aspart (novoLOG) injection 12 Units   . insulin aspart (novoLOG) injection 6 Units   . insulin detemir (LEVEMIR) injection 35 Units   . lidocaine (PF) (XYLOCAINE) 1 % injection   . lidocaine (XYLOCAINE) 2 % jelly   . midazolam (VERSED) injection Patient Transfer  . fentaNYL (SUBLIMAZE) injection Patient Transfer  . furosemide (LASIX) tablet 20 mg     Social History:  reports that she has never smoked. She does not have any smokeless tobacco history on file. She reports that she does not drink alcohol. Her drug history is not on file.  Family History:   Family History  Problem Relation Age of Onset  . Heart disease Father   . Heart disease Mother   .  Heart disease Brother     Pertinent items are noted in HPI. Weight change: 0.4 kg (14.1 oz)  Intake/Output Summary (Last 24 hours) at 06/12/15 0732 Last data filed at 06/12/15 0600  Gross per 24 hour  Intake 737.17 ml  Output   1300 ml  Net -562.83 ml   BP 136/65 mmHg  Pulse 68  Temp(Src) 98.4 F (36.9 C) (Oral)  Resp 20  Ht 5\' 6"  (1.676 m)  Wt 128.5 kg (283 lb 4.7 oz)  BMI 45.75 kg/m2  SpO2 97% Filed Vitals:   06/11/15 1226 06/11/15 1800  06/11/15 2003 06/12/15 0529  BP: 165/54 160/55 142/52 136/65  Pulse: 63 61 74 68  Temp: 97.3 F (36.3 C) 98.2 F (36.8 C) 98.3 F (36.8 C) 98.4 F (36.9 C)  TempSrc: Oral Oral Oral Oral  Resp: 20 20 19 20   Height:      Weight:    128.5 kg (283 lb 4.7 oz)  SpO2: 98% 96% 96% 97%     General appearance: alert, cooperative and no distress Head: Normocephalic, without obvious abnormality, atraumatic Eyes: negative findings: lids and lashes normal, conjunctivae and sclerae normal and corneas clear Neck: no adenopathy, no carotid bruit, no JVD, supple, symmetrical, trachea midline and thyroid not enlarged, symmetric, no tenderness/mass/nodules Resp: clear to auscultation bilaterally Cardio: regular rate and rhythm, S1, S2 normal, no murmur, click, rub or gallop GI: soft, non-tender; bowel sounds normal; no masses,  no organomegaly Extremities: extremities normal, atraumatic, no cyanosis or edema  Labs: Basic Metabolic Panel:  Recent Labs Lab 06/08/15 2002 06/09/15 0847 06/12/15 0515  NA 140 140 139  K 4.9 4.8 4.9  CL 109 111 111  CO2 24 21* 21*  GLUCOSE 117* 127* 129*  BUN 40* 39* 44*  CREATININE 2.78* 2.63* 3.02*  ALBUMIN 2.9*  --   --   CALCIUM 9.0 8.8* 8.4*   Liver Function Tests:  Recent Labs Lab 06/08/15 2002  AST 16  ALT 11*  ALKPHOS 51  BILITOT 0.4  PROT 9.9*  ALBUMIN 2.9*   No results for input(s): LIPASE, AMYLASE in the last 168 hours. No results for input(s): AMMONIA in the last 168  hours. CBC:  Recent Labs Lab 06/08/15 2002 06/09/15 0847 06/12/15 0515  WBC 7.2 7.3 7.3  NEUTROABS 4.9  --   --   HGB 7.1* 7.5* 7.2*  HCT 24.4* 25.0* 24.0*  MCV 87.8 88.3 87.6  PLT 344 309 313   PT/INR: @LABRCNTIP (inr:5) Cardiac Enzymes: ) Recent Labs Lab 06/09/15 0847  TROPONINI <0.03   CBG:  Recent Labs Lab 06/11/15 0736 06/11/15 1122 06/11/15 1221 06/11/15 1653 06/11/15 2057  GLUCAP 109* 175* 186* 251* 143*    Iron Studies:  Recent Labs Lab 06/09/15 0057  IRON 24*  TIBC 386  FERRITIN 43    Xrays/Other Studies: Dg Chest Port 1 View  06/11/2015  CLINICAL DATA:  Status post bronchoscopy with biopsy. EXAM: PORTABLE CHEST 1 VIEW COMPARISON:  Chest x-ray and chest CT scan of June 08, 2015 FINDINGS: The lungs are slightly less well inflated today. There is no pneumothorax, pneumomediastinum, or pleural effusion. The pulmonary interstitial markings remain increased. The cardiac silhouette is top-normal to mildly enlarged but stable. The pulmonary vascularity is mildly engorged. The mediastinum is normal in width. IMPRESSION: There is no postprocedure complication following bronchoscopy. There are persistent patchy interstitial and alveolar airspace opacities bilaterally. Stable mild cardiomegaly with mild central pulmonary vascular prominence. Electronically Signed   By: David  Martinique M.D.   On: 06/11/2015 11:28   Dg C-arm Bronchoscopy  06/11/2015  CLINICAL DATA:  C-ARM BRONCHOSCOPY Fluoroscopy was utilized by the requesting physician.  No radiographic interpretation.    Assessment/Plan: 1.  AKI/CKD stage 4- following IV diuresis but no significant change from her baseline Scr.  Agree with holding lasix today and follow UOP and Scr levels.   2. Dyspnea- multifactorial and improved after diuresis. 3. Pulmonary nodules- Diffuse Bilateral Pulmonary Nodules - unchanged from prior CT scan in June 2016, concern for autoimmune process vs malignancy, s/p bronchoscopy and  evaluated by PCCM.  No elevated WBC,  fever, or evidence of infection.  F/u bronch results with PCCM 4. HTN- has been labile at times, under better control at this time 5. DM- per primary svc 6. Anemia of chronic disease and iron deficiency- will plan for IV iron and initiation of ESA therapy 7. Hypothyroidism- on replacement therapy 8. Hyperlipidemia- on statin 9. Morbid obesity   [name] 06/12/2015, 7:32 AM

## 2015-06-12 NOTE — Care Management Important Message (Signed)
Important Message  Patient Details  Name: Sheri Pearson MRN: [id number] Date of Birth: April 18, 1946   Medicare Important Message Given:  Yes    Skila Rollins P Hrishikesh Hoeg 06/12/2015, 4:30 PM

## 2015-06-12 NOTE — Progress Notes (Signed)
Follow up appt to review FOB results with Dr. Lamonte Sakai arranged - see discharge section for details.     Noe Gens, NP-C [facility] Pgr: [phone] or if no answer [phone] 06/12/2015, 9:00 AM

## 2015-06-12 NOTE — Progress Notes (Signed)
PROGRESS NOTE    Sheri Pearson [id number] DOB: 05-25-1945 DOA: 06/08/2015 PCP: Arcelia Jew, FNP  Primary Nephrologist: Dr. Erling Cruz.  HPI/Brief narrative 70 year old female patient with history of stage IV chronic kidney disease, DM 2, HTN, HLD, chronic anemia presented to the [facility] ED on 06/09/15 for worsening dyspnea of 2 weeks' duration. She was seen by PCP 2 days prior to admission and treated with Lasix for some lower extremity edema. No change in dyspnea despite Lasix. CT chest showed bilateral nodular opacities. Pulmonology was consulted and s/p FOB on 1/26. Hemoglobin 7.1 on admission. FOBT negative.   Assessment/Plan:   1. Acute respiratory failure with hypoxia: Probably multifactorial: Morbid obesity, possible decompensated CHF in the context of long-standing hypertension and lower extremity edema with response to diuretics inpatient, rule out PAH (? Undiagnosed OSA/OHS), anemia and bilateral pulmonary nodules. CT chest without contrast shows bilateral pulmonary nodules but stable since 2016 June. Etiology of pulmonary nodules not clear but suspect an inflammatory process, either autoimmune or smoldering infection. Pulmonology follow-up appreciated and s/p FOB on 1/26. Remains on empiric IV levofloxacin. Diuresed with 2 doses of Lasix on 1/24. Improved. Wean oxygen as tolerated. Complete 5 days of antibiotics on 1/28. Outpatient follow-up with Dr. Kyung Rudd, pulmonology on 06/16/15 at 3:30 PM to go over FOB results. 2. Possible acute diastolic CHF: Treated with Lasix with good effect. -2.069 since admission. Lasix discontinued on 1/27 due to worsening renal insufficiency. Patient clinically euvolemic now. 3. Type II DM: Patient had recurrent hypoglycemia on 1/24 and 1/25. This despite not having received her Levemir since admission. Thereby discontinued Levemir and mealtime NovoLog and placed only on NovoLog SSI. No hypoglycemia. CBGs mildly uncontrolled and fluctuating. Monitor  closely. 4. Acute on Stage IV chronic kidney disease: Follows with Dr. Florene Glen, Nephrology. Baseline creatinine is not known. Creatinine on 11/13/14:4.32. Presented this admission with creatinine of 2.78. Creatinine had improved to 2.6 but worsened to 3.02 after couple days of low-dose oral Lasix. Lasix held. Nephrology consulted. Follow BMP in a.m. 5. Essential hypertension: Mildly uncontrolled. Continue home dose of carvedilol, verapamil and hydralazine 6. Anemia in chronic kidney disease: Stable. Plan for IV iron and initiation of ESA therapy per nephrology. 7. Hypothyroid: Continue Synthroid 8. Hyperlipidemia: Continue statins 9. Morbid obesity/Body mass index is 45.75 kg/(m^2).    DVT prophylaxis: Subcutaneous heparin Code Status: Full Family Communication: None at bedside today Disposition Plan: Possible DC home in the next 24-48 hours pending improvement in renal function.   Consultants:  Pulmonology  Nephrology.  Procedures:  Fiberoptic bronchoscopy 06/10/14  2-D echo: Study Conclusions  - Left ventricle: The cavity size was normal. Wall thickness was increased in a pattern of mild LVH. Systolic function was normal. The estimated ejection fraction was in the range of 60% to 65%. Wall motion was normal; there were no regional wall motion abnormalities. Doppler parameters are consistent with abnormal left ventricular relaxation (grade 1 diastolic dysfunction). - Aortic valve: There was no stenosis. There was trivial regurgitation. - Mitral valve: There was trivial regurgitation. - Left atrium: The atrium was moderately dilated. - Right ventricle: The cavity size was normal. Systolic function was normal. - Atrial septum: Cannot rule out small PFO. - Pulmonary arteries: No complete TR doppler jet so unable to estimate PA systolic pressure. - Systemic veins: IVC measured 2.3 cm with < 50% respirophasic variation, suggesting RA pressure 15  mmHg.  Impressions:  - Normal LV size with mild LV hypertrophy. EF 60-65%. Normal RV size and systolic function. Moderate  LAE. No significant valvular abnormalities. IVC dilated, suggestive of elevated RV filling pressure.  Antimicrobials:  IV levofloxacin   Subjective: Denies complaints.  Objective: Filed Vitals:   06/11/15 1800 06/11/15 2003 06/12/15 0529 06/12/15 1000  BP: 160/55 142/52 136/65 142/52  Pulse: 61 74 68 76  Temp: 98.2 F (36.8 C) 98.3 F (36.8 C) 98.4 F (36.9 C) 98.2 F (36.8 C)  TempSrc: Oral Oral Oral Oral  Resp: 20 19 20 20   Height:      Weight:   128.5 kg (283 lb 4.7 oz)   SpO2: 96% 96% 97% 98%    Intake/Output Summary (Last 24 hours) at 06/12/15 1457 Last data filed at 06/12/15 1300  Gross per 24 hour  Intake 1337.17 ml  Output   1400 ml  Net -62.83 ml   Filed Weights   06/09/15 0327 06/10/15 2014 06/12/15 0529  Weight: 126.009 kg (277 lb 12.8 oz) 128.1 kg (282 lb 6.6 oz) 128.5 kg (283 lb 4.7 oz)    Exam:  General exam: Morbidly obese, moderately built pleasant middle-aged female lying comfortably supine in bed. Respiratory system: Clear. No increased work of breathing. Cardiovascular system: S1 & S2 heard, RRR. No JVD, murmurs, gallops, clicks or pedal edema. Gastrointestinal system: Abdomen is nondistended, soft and nontender. Normal bowel sounds heard. Central nervous system: Alert and oriented. No focal neurological deficits. Extremities: Symmetric 5 x 5 power.   Data Reviewed: Basic Metabolic Panel:  Recent Labs Lab 06/08/15 2002 06/09/15 0847 06/12/15 0515  NA 140 140 139  K 4.9 4.8 4.9  CL 109 111 111  CO2 24 21* 21*  GLUCOSE 117* 127* 129*  BUN 40* 39* 44*  CREATININE 2.78* 2.63* 3.02*  CALCIUM 9.0 8.8* 8.4*   Liver Function Tests:  Recent Labs Lab 06/08/15 2002  AST 16  ALT 11*  ALKPHOS 51  BILITOT 0.4  PROT 9.9*  ALBUMIN 2.9*   No results for input(s): LIPASE, AMYLASE in the last 168 hours. No  results for input(s): AMMONIA in the last 168 hours. CBC:  Recent Labs Lab 06/08/15 2002 06/09/15 0847 06/12/15 0515  WBC 7.2 7.3 7.3  NEUTROABS 4.9  --   --   HGB 7.1* 7.5* 7.2*  HCT 24.4* 25.0* 24.0*  MCV 87.8 88.3 87.6  PLT 344 309 313   Cardiac Enzymes:  Recent Labs Lab 06/09/15 0847  TROPONINI <0.03   BNP (last 3 results) No results for input(s): PROBNP in the last 8760 hours. CBG:  Recent Labs Lab 06/11/15 1221 06/11/15 1653 06/11/15 2057 06/12/15 0756 06/12/15 1142  GLUCAP 186* 251* 143* 134* 184*    Recent Results (from the past 240 hour(s))  Culture, blood (Routine X 2) w Reflex to ID Panel     Status: None (Preliminary result)   Collection Time: 06/08/15 10:30 PM  Result Value Ref Range Status   Specimen Description BLOOD RIGHT ARM  Final   Special Requests BOTTLES DRAWN AEROBIC AND ANAEROBIC 4ML  Final   Culture NO GROWTH 3 DAYS  Final   Report Status PENDING  Incomplete  Culture, blood (Routine X 2) w Reflex to ID Panel     Status: None (Preliminary result)   Collection Time: 06/08/15 10:51 PM  Result Value Ref Range Status   Specimen Description BLOOD RIGHT ARM  Final   Special Requests BOTTLES DRAWN AEROBIC AND ANAEROBIC 5ML  Final   Culture NO GROWTH 3 DAYS  Final   Report Status PENDING  Incomplete  Culture, bal-quantitative  Status: None (Preliminary result)   Collection Time: 06/11/15 10:45 AM  Result Value Ref Range Status   Specimen Description BRONCHIAL ALVEOLAR LAVAGE  Final   Special Requests RLL  Final   Gram Stain   Final    RARE WBC PRESENT,BOTH PMN AND MONONUCLEAR NO SQUAMOUS EPITHELIAL CELLS SEEN NO ORGANISMS SEEN Performed at [facility]    Culture PENDING  Incomplete   Report Status PENDING  Incomplete  Fungus Culture with Smear     Status: None (Preliminary result)   Collection Time: 06/11/15 10:45 AM  Result Value Ref Range Status   Specimen Description BRONCHIAL ALVEOLAR LAVAGE  Final   Special Requests  RLL  Final   Fungal Smear   Final    NO YEAST OR FUNGAL ELEMENTS SEEN Performed at [facility]    Culture   Final    CULTURE IN PROGRESS FOR FOUR WEEKS Performed at [facility]    Report Status PENDING  Incomplete         Studies: Dg Chest Port 1 View  06/11/2015  CLINICAL DATA:  Status post bronchoscopy with biopsy. EXAM: PORTABLE CHEST 1 VIEW COMPARISON:  Chest x-ray and chest CT scan of June 08, 2015 FINDINGS: The lungs are slightly less well inflated today. There is no pneumothorax, pneumomediastinum, or pleural effusion. The pulmonary interstitial markings remain increased. The cardiac silhouette is top-normal to mildly enlarged but stable. The pulmonary vascularity is mildly engorged. The mediastinum is normal in width. IMPRESSION: There is no postprocedure complication following bronchoscopy. There are persistent patchy interstitial and alveolar airspace opacities bilaterally. Stable mild cardiomegaly with mild central pulmonary vascular prominence. Electronically Signed   By: David  Martinique M.D.   On: 06/11/2015 11:28   Dg C-arm Bronchoscopy  06/11/2015  CLINICAL DATA:  C-ARM BRONCHOSCOPY Fluoroscopy was utilized by the requesting physician.  No radiographic interpretation.        Scheduled Meds: . carvedilol  25 mg Oral BID WC  . darbepoetin (ARANESP) injection - NON-DIALYSIS  50 mcg Subcutaneous Q Fri-1800  . ferrous sulfate  325 mg Oral BID WC  . ferumoxytol  510 mg Intravenous Weekly  . heparin  5,000 Units Subcutaneous 3 times per day  . hydrALAZINE  25 mg Oral 3 times per day  . insulin aspart  0-9 Units Subcutaneous TID WC  . levofloxacin (LEVAQUIN) IV  750 mg Intravenous Q48H  . levothyroxine  112 mcg Oral QAC breakfast  . omega-3 acid ethyl esters  1 g Oral Daily  . rosuvastatin  10 mg Oral QHS  . verapamil  120 mg Oral BID  . vitamin B-12  2,500 mcg Oral Q1200   Continuous Infusions:    Principal Problem:   Acute respiratory failure  with hypoxia (HCC) Active Problems:   Essential hypertension   Pulmonary infiltrates on CXR   DM (diabetes mellitus), type 2 with renal complications (HCC)   Chronic anemia   CKD (chronic kidney disease) stage 4, GFR 15-29 ml/min (HCC)   Pulmonary nodules/lesions, multiple    Time spent: 30 minutes.    Vernell Leep, MD, FACP, FHM. [facility] Pager [phone]  If 7PM-7AM, please contact night-coverage [email or web address] Password [email or web address] 06/12/2015, 2:57 PM    LOS: 3 days

## 2015-06-13 LAB — BASIC METABOLIC PANEL
Anion gap: 8 (ref 5–15)
BUN: 45 mg/dL — AB (ref 6–20)
CHLORIDE: 111 mmol/L (ref 101–111)
CO2: 20 mmol/L — ABNORMAL LOW (ref 22–32)
CREATININE: 3 mg/dL — AB (ref 0.44–1.00)
Calcium: 8.6 mg/dL — ABNORMAL LOW (ref 8.9–10.3)
GFR calc Af Amer: 17 mL/min — ABNORMAL LOW (ref >60)
GFR calc non Af Amer: 15 mL/min — ABNORMAL LOW (ref >60)
Glucose, Bld: 118 mg/dL — ABNORMAL HIGH (ref 65–99)
Potassium: 4.9 mmol/L (ref 3.5–5.1)
SODIUM: 139 mmol/L (ref 135–145)

## 2015-06-13 LAB — GLUCOSE, CAPILLARY
GLUCOSE-CAPILLARY: 128 mg/dL — AB (ref 65–99)
Glucose-Capillary: 172 mg/dL — ABNORMAL HIGH (ref 65–99)

## 2015-06-13 LAB — CBC
HEMATOCRIT: 23.8 % — AB (ref 36.0–46.0)
HEMOGLOBIN: 7.2 g/dL — AB (ref 12.0–15.0)
MCH: 26.7 pg (ref 26.0–34.0)
MCHC: 30.3 g/dL (ref 30.0–36.0)
MCV: 88.1 fL (ref 78.0–100.0)
Platelets: 293 10*3/uL (ref 150–400)
RBC: 2.7 MIL/uL — AB (ref 3.87–5.11)
RDW: 16 % — AB (ref 11.5–15.5)
WBC: 6.4 10*3/uL (ref 4.0–10.5)

## 2015-06-13 MED ORDER — LORAZEPAM 0.5 MG PO TABS
0.5000 mg | ORAL_TABLET | Freq: Once | ORAL | Status: AC
  Administered 2015-06-13: 0.5 mg via ORAL
  Filled 2015-06-13: qty 1

## 2015-06-13 MED ORDER — INSULIN ASPART 100 UNIT/ML ~~LOC~~ SOLN: 0.0000 [IU] | Freq: Three times a day (TID) | SUBCUTANEOUS | Status: DC

## 2015-06-13 MED ORDER — FUROSEMIDE 20 MG PO TABS: 20.0000 mg | ORAL_TABLET | Freq: Every day | ORAL | Status: DC

## 2015-06-13 NOTE — Discharge Summary (Addendum)
Physician Discharge Summary  Sheri Pearson VOJ:[id number] DOB: Mar 08, 1946 DOA: 06/08/2015  PCP: Sheri Jew, FNP  Admit date: 06/08/2015 Discharge date: 06/13/2015  Time spent: Rate of than 30 minutes  Recommendations for Outpatient Follow-up:  1. Sheri Jew, FNP/PCP in 1 week. Will need adjustment of diabetic medications. Please follow final blood culture results that were sent from the hospital. 2. Sheri Pearson, Nephrology in 1 week. To be seen with repeat labs (CBC & BMP). 3. Sheri Pearson, Pulmonology on 06/16/15 at 3:30 PM. Follow-up of bronchoscopy results.  Discharge Diagnoses:  Principal Problem:   Acute respiratory failure with hypoxia ([facility]) Active Problems:   Essential hypertension   Pulmonary infiltrates on CXR   DM (diabetes mellitus), type 2 with renal complications (HCC)   Chronic anemia   CKD (chronic kidney disease) stage 4, GFR 15-29 ml/min (HCC)   Pulmonary nodules/lesions, multiple   Discharge Condition: Improved & Stable  Diet recommendation: Heart healthy and diabetic diet.  Filed Weights   06/12/15 0529 06/12/15 2025 06/13/15 0500  Weight: 128.5 kg (283 lb 4.7 oz) 129.241 kg (284 lb 14.8 oz) 129.241 kg (284 lb 14.8 oz)    History of present illness:  70 year old female patient with history of stage IV chronic kidney disease, DM 2, HTN, HLD, chronic anemia presented to the [facility] ED on 06/09/15 for worsening dyspnea of 2 weeks' duration. She was seen by PCP 2 days prior to admission and treated with Lasix for some lower extremity edema. No change in dyspnea despite Lasix. CT chest showed bilateral nodular opacities. Pulmonology was consulted and s/p FOB on 1/26. Hemoglobin 7.1 on admission. FOBT negative.  Hospital Course:    1. Acute respiratory failure with hypoxia: Probably multifactorial: Morbid obesity, possible decompensated CHF in the context of long-standing hypertension and lower extremity edema with good response to diuretics inpatient,  rule out PAH (? Undiagnosed OSA/OHS), anemia and bilateral pulmonary nodules. CT chest without contrast shows bilateral pulmonary nodules but stable since 2016 June. Etiology of pulmonary nodules not clear but suspect an inflammatory process, either autoimmune or smoldering infection. Pulmonology follow-up appreciated and s/p FOB on 1/26. Completed 6 days of levofloxacin treatment. Received a dose of IV Lasix in the ED followed by home dose of oral Lasix for a couple of days with good diuresis and symptom relief. Lasix held yesterday due to rising creatinine. Discussed with nephrology/Sheri Pearson today who recommends resuming home dose of Lasix 20 mg daily beginning 1/29 and outpatient follow-up with primary nephrologist in one week with repeat labs (he will assist with appointment). Hypoxia has resolved. Outpatient follow-up with Sheri Pearson, pulmonology on 06/16/15 at 3:30 PM to go over FOB results. 2. Bilateral pulmonary nodules: Evaluation & management as indicated per problem #1. ESR >140, CRP 1.9, scleroderma (SCL-70) (ENA) antibody, IgG: Negative, rheumatoid factor: Negative, c-ANCA & p- ANCA: Negative, C3: 128/normal, C4: 39/normal & ANA: Negative. 3. Possible acute diastolic CHF: Received a dose of IV Lasix in the ED followed by home dose of oral Lasix for a couple of days with good diuresis and symptom relief. Lasix held yesterday due to rising creatinine. Creatinine stable. Discussed with nephrology/Sheri Pearson today who recommends resuming home dose of Lasix 20 mg daily beginning 1/29 . Compensated. 4. Type II DM: Patient had recurrent hypoglycemia on 1/24 and 1/25. This despite not having received her Levemir since admission. Thereby discontinued Levemir and mealtime NovoLog and placed only on NovoLog SSI. No hypoglycemia. CBGs. Despite not being on Levemir or scheduled mealtime NovoLog, patient's CBGs  are reasonably controlled as indicated below. Unsure why her insulin requirement has decreased-?  Renal insufficiency. She denies frequent hypoglycemic episodes at home. At this time plan to hold Levemir and scheduled mealtime NovoLog and patient will be discharged on SSI. She is advised to follow-up with her PCP in a few days to reassess. 5. Acute on Stage IV chronic kidney disease: Follows with Dr. Florene Pearson, Nephrology. Baseline creatinine is not known. Creatinine on 11/13/14:4.32. Presented this admission with creatinine of 2.78. Creatinine had improved to 2.6 but worsened to 3.02 after couple days of low-dose oral Lasix. Lasix held yesterday. Nephrology input appreciated. Creatinine stable. Discussed with nephrology who have cleared her for discharge home to resume prior home dose of Lasix from tomorrow and they will arrange for outpatient follow-up with repeat labs next week. 6. Essential hypertension: Mildly uncontrolled. Continue home dose of carvedilol, verapamil and hydralazine 7. Anemia in chronic kidney disease: Stable. S/P IV iron and initiation of ESA therapy per nephrology. Continue oral iron supplements at discharge. 8. Hypothyroid: Continue Synthroid 9. Hyperlipidemia: Continue statins 10. Morbid obesity/Body mass index is 45.75 kg/(m^2).   Discussed with patient's son Mr. Sheri Pearson in detail. Updated care and answered questions.   Consultants:  Pulmonology  Nephrology.  Procedures:  Fiberoptic bronchoscopy 06/10/14  2-D echo: Study Conclusions  - Left ventricle: The cavity size was normal. Wall thickness was increased in a pattern of mild LVH. Systolic function was normal. The estimated ejection fraction was in the range of 60% to 65%. Wall motion was normal; there were no regional wall motion abnormalities. Doppler parameters are consistent with abnormal left ventricular relaxation (grade 1 diastolic dysfunction). - Aortic valve: There was no stenosis. There was trivial regurgitation. - Mitral valve: There was trivial regurgitation. - Left atrium:  The atrium was moderately dilated. - Right ventricle: The cavity size was normal. Systolic function was normal. - Atrial septum: Cannot rule out small PFO. - Pulmonary arteries: No complete TR doppler jet so unable to estimate PA systolic pressure. - Systemic veins: IVC measured 2.3 cm with < 50% respirophasic variation, suggesting RA pressure 15 mmHg.  Impressions:  - Normal LV size with mild LV hypertrophy. EF 60-65%. Normal RV size and systolic function. Moderate LAE. No significant valvular abnormalities. IVC dilated, suggestive of elevated RV filling pressure.    Discharge Exam:  Complaints: Mild intermittent dry cough. No dyspnea. Denies any other complaints.  Filed Vitals:   06/12/15 2025 06/13/15 0500 06/13/15 0501 06/13/15 0932  BP: 147/48  165/50 154/57  Pulse: 70  70 77  Temp: 98.3 F (36.8 C)  98.2 F (36.8 C) 97.6 F (36.4 C)  TempSrc: Tympanic  Oral Oral  Resp: _0 Height:      Weight: 129.241 kg (284 lb 14.8 oz) 129.241 kg (284 lb 14.8 oz)    SpO2: 98%  97% 98%    General exam: Morbidly obese, moderately built pleasant middle-aged female lying comfortably supine in bed. Respiratory system: Clear. No increased work of breathing. Cardiovascular system: S1 & S2 heard, RRR. No JVD, murmurs, gallops, clicks or pedal edema. Gastrointestinal system: Abdomen is nondistended, soft and nontender. Normal bowel sounds heard. Central nervous system: Alert and oriented. No focal neurological deficits. Extremities: Symmetric 5 x 5 power.  Discharge Instructions      Discharge Instructions    (HEART FAILURE PATIENTS) Call MD:  Anytime you have any of the following symptoms: 1) 3 pound weight gain in 24 hours or 5 pounds in 1  week 2) shortness of breath, with or without a dry hacking cough 3) swelling in the hands, feet or stomach 4) if you have to sleep on extra pillows at night in order to breathe.    Complete by:  As directed      Call MD for:   difficulty breathing, headache or visual disturbances    Complete by:  As directed      Call MD for:  extreme fatigue    Complete by:  As directed      Call MD for:  persistant dizziness or light-headedness    Complete by:  As directed      Call MD for:  persistant nausea and vomiting    Complete by:  As directed      Call MD for:  severe uncontrolled pain    Complete by:  As directed      Call MD for:  temperature >100.4    Complete by:  As directed      Diet - low sodium heart healthy    Complete by:  As directed      Diet Carb Modified    Complete by:  As directed      Increase activity slowly    Complete by:  As directed             Medication List    STOP taking these medications        insulin detemir 100 UNIT/ML injection  Commonly known as:  LEVEMIR      TAKE these medications        acetaminophen 650 MG CR tablet  Commonly known as:  TYLENOL  Take 650 mg by mouth every 8 (eight) hours as needed for pain.     aspirin EC 81 MG tablet  Take 81 mg by mouth at bedtime.     B-12 2500 MCG Tabs  Take 2,500 mcg by mouth daily at 12 noon.     carbamide peroxide 6.5 % otic solution  Commonly known as:  DEBROX  Place 1 drop into both ears daily as needed (ear wax).     carvedilol 25 MG tablet  Commonly known as:  COREG  Take 25 mg by mouth 2 (two) times daily with a meal.     ferrous sulfate 325 (65 FE) MG EC tablet  Take 325 mg by mouth 2 (two) times daily.     FISH OIL PO  Take 1 capsule by mouth daily at 12 noon.     furosemide 20 MG tablet  Commonly known as:  LASIX  Take 1 tablet (20 mg total) by mouth daily.  Start taking on:  06/14/2015     hydrALAZINE 25 MG tablet  Commonly known as:  APRESOLINE  Take 25 mg by mouth 3 (three) times daily.     insulin aspart 100 UNIT/ML injection  Commonly known as:  novoLOG  Inject 0-9 Units into the skin 3 (three) times daily with meals. CBG < 70: Eat or drink something and recheck, CBG 70 - 120: 0 units CBG 121  - 150: 1 unit CBG 151 - 200: 2 units CBG 201 - 250: 3 units CBG 251 - 300: 5 units CBG 301 - 350: 7 units CBG 351 - 400: 9 units CBG > 400: call MD.     levothyroxine 112 MCG tablet  Commonly known as:  SYNTHROID, LEVOTHROID  Take 112 mcg by mouth daily before breakfast.     meclizine 25 MG tablet  Commonly known  as:  ANTIVERT  Take 25 mg by mouth 3 (three) times daily as needed for dizziness.     rosuvastatin 10 MG tablet  Commonly known as:  CRESTOR  Take 10 mg by mouth at bedtime.     verapamil 120 MG 24 hr capsule  Commonly known as:  VERELAN PM  Take 120 mg by mouth 2 (two) times daily.       Follow-up Information    Follow up with Leslye Peer., MD On 06/16/2015.   Specialty:  Pulmonary Disease   Why:  Appt at 3:30 PM    Contact information:   [address] [place] [address] [phone]       Follow up with [facility], MD. Schedule an appointment as soon as possible for a visit in 1 week.   Specialty:  Nephrology   Why:  To be seen with repeat labs (CBC & BMP).   Contact information:   [address]                          [place] [address] [phone]       Follow up with Omar Person, FNP. Schedule an appointment as soon as possible for a visit in 1 week.   Specialty:  Family Medicine   Why:  Post hospital discharge follow-up. Will need follow-up and adjustment of diabetic medications.   Contact information:   # [address] [place] [address] [phone]        The results of significant diagnostics from this hospitalization (including imaging, microbiology, ancillary and laboratory) are listed below for reference.    Significant Diagnostic Studies: Dg Chest 2 View  06/08/2015  CLINICAL DATA:  Shortness of breath, fatigue in fluid retention in both legs in the chest. Initial encounter. EXAM: CHEST  2 VIEW COMPARISON:  PA and lateral chest 11/10/2014 and 11/25/2014. CT chest 11/08/2014. FINDINGS: There is patchy airspace  disease in the lingula and right lower lobe. The appearance is not grossly changed compared to the most recent examination. There is cardiomegaly without plain film evidence of pulmonary edema. No pneumothorax or pleural effusion. IMPRESSION: Patchy bilateral airspace disease appears unchanged compared to the most recent study most compatible with pneumonia, possibly atypical. The appearance is not suggestive of pulmonary edema. Electronically Signed   By: Drusilla Kanner M.D.   On: 06/08/2015 20:08   Ct Chest Wo Contrast  06/09/2015  CLINICAL DATA:  Subacute onset of shortness of breath and dyspnea on exertion. Initial encounter. EXAM: CT CHEST WITHOUT CONTRAST TECHNIQUE: Multidetector CT imaging of the chest was performed following the standard protocol without IV contrast. COMPARISON:  Chest radiograph performed earlier today at 7:57 p.m., and CT of the chest performed 11/08/2014 FINDINGS: Prominent bilateral nodular airspace opacities are seen, of varying size and scattered throughout both lungs. The distribution and size of these opacities is essentially unchanged from the prior study in June 2016. It is unusual for opacities to remain unchanged for 7 months; malignancy is considered unlikely, and atypical infection relatively unlikely. An inflammatory condition such as sarcoidosis might have such an appearance, though the relative lack of lymphadenopathy suggests against sarcoidosis. No significant pleural effusion or pneumothorax is seen. The heart is mildly enlarged. Trace pericardial fluid remains within normal limits. Visualized mediastinal nodes remain normal in size. The hila are difficult to fully assess due to surrounding opacities. The great vessels are grossly unremarkable in appearance. The visualized portions of the thyroid  gland are unremarkable. No axillary lymphadenopathy is seen. The visualized portions of the liver and spleen are unremarkable. The visualized portions of the pancreas,  adrenal glands and kidneys are within normal limits. Nonspecific bilateral perinephric stranding is noted. No acute osseous abnormalities are identified. Anterior bridging osteophytes are noted along the mid thoracic spine. A stable focal 1.2 cm lucency is noted along the right side of the manubrium. IMPRESSION: 1. Prominent bilateral nodular airspace opacities, of varying size and scattered throughout both lungs, are essentially unchanged from the prior study in June 2016. It is unusual for opacities to remain entirely unchanged for 7 months. Malignancy is considered unlikely, and atypical infection relatively unlikely. An inflammatory condition such as sarcoidosis might have such an appearance, though the relative lack of adenopathy suggests against sarcoidosis. Pulmonary consultation would be helpful. 2. Mild cardiomegaly noted. 3. Stable focal 1.2 cm lucency along the right side of the manubrium, nonspecific in appearance. These results were called by telephone at the time of interpretation on 06/08/2015 at 11:48 pm to Dr. Tanna Furry, who verbally acknowledged these results. Electronically Signed   By: Garald Balding M.D.   On: 06/09/2015 00:00   Dg Chest Port 1 View  06/11/2015  CLINICAL DATA:  Status post bronchoscopy with biopsy. EXAM: PORTABLE CHEST 1 VIEW COMPARISON:  Chest x-ray and chest CT scan of June 08, 2015 FINDINGS: The lungs are slightly less well inflated today. There is no pneumothorax, pneumomediastinum, or pleural effusion. The pulmonary interstitial markings remain increased. The cardiac silhouette is top-normal to mildly enlarged but stable. The pulmonary vascularity is mildly engorged. The mediastinum is normal in width. IMPRESSION: There is no postprocedure complication following bronchoscopy. There are persistent patchy interstitial and alveolar airspace opacities bilaterally. Stable mild cardiomegaly with mild central pulmonary vascular prominence. Electronically Signed   By: David   Martinique M.D.   On: 06/11/2015 11:28   Dg C-arm Bronchoscopy  06/11/2015  CLINICAL DATA:  C-ARM BRONCHOSCOPY Fluoroscopy was utilized by the requesting physician.  No radiographic interpretation.    Microbiology: Recent Results (from the past 240 hour(s))  Culture, blood (Routine X 2) w Reflex to ID Panel     Status: None (Preliminary result)   Collection Time: 06/08/15 10:30 PM  Result Value Ref Range Status   Specimen Description BLOOD RIGHT ARM  Final   Special Requests BOTTLES DRAWN AEROBIC AND ANAEROBIC 4ML  Final   Culture NO GROWTH 4 DAYS  Final   Report Status PENDING  Incomplete  Culture, blood (Routine X 2) w Reflex to ID Panel     Status: None (Preliminary result)   Collection Time: 06/08/15 10:51 PM  Result Value Ref Range Status   Specimen Description BLOOD RIGHT ARM  Final   Special Requests BOTTLES DRAWN AEROBIC AND ANAEROBIC 5ML  Final   Culture NO GROWTH 4 DAYS  Final   Report Status PENDING  Incomplete  Culture, bal-quantitative     Status: None (Preliminary result)   Collection Time: 06/11/15 10:45 AM  Result Value Ref Range Status   Specimen Description BRONCHIAL ALVEOLAR LAVAGE  Final   Special Requests RLL  Final   Gram Stain   Final    RARE WBC PRESENT,BOTH PMN AND MONONUCLEAR NO SQUAMOUS EPITHELIAL CELLS SEEN NO ORGANISMS SEEN Performed at [facility]    Culture PENDING  Incomplete   Report Status PENDING  Incomplete  Fungus Culture with Smear     Status: None (Preliminary result)   Collection Time: 06/11/15 10:45 AM  Result  Value Ref Range Status   Specimen Description BRONCHIAL ALVEOLAR LAVAGE  Final   Special Requests RLL  Final   Fungal Smear   Final    NO YEAST OR FUNGAL ELEMENTS SEEN Performed at [facility]    Culture   Final    CULTURE IN PROGRESS FOR FOUR WEEKS Performed at [facility]    Report Status PENDING  Incomplete  AFB culture with smear (NOT at [facility])     Status: None (Preliminary result)   Collection  Time: 06/11/15 10:45 AM  Result Value Ref Range Status   Specimen Description BRONCHIAL ALVEOLAR LAVAGE  Final   Special Requests RLL  Final   Acid Fast Smear   Final    NO ACID FAST BACILLI SEEN Performed at [facility]    Culture   Final    CULTURE WILL BE EXAMINED FOR 6 WEEKS BEFORE ISSUING A FINAL REPORT Performed at [facility]    Report Status PENDING  Incomplete     Labs: Basic Metabolic Panel:  Recent Labs Lab 06/08/15 2002 06/09/15 0847 06/12/15 0515 06/13/15 0429  NA 140 140 139 139  K 4.9 4.8 4.9 4.9  CL 109 111 111 111  CO2 24 21* 21* 20*  GLUCOSE 117* 127* 129* 118*  BUN 40* 39* 44* 45*  CREATININE 2.78* 2.63* 3.02* 3.00*  CALCIUM 9.0 8.8* 8.4* 8.6*   Liver Function Tests:  Recent Labs Lab 06/08/15 2002  AST 16  ALT 11*  ALKPHOS 51  BILITOT 0.4  PROT 9.9*  ALBUMIN 2.9*   No results for input(s): LIPASE, AMYLASE in the last 168 hours. No results for input(s): AMMONIA in the last 168 hours. CBC:  Recent Labs Lab 06/08/15 2002 06/09/15 0847 06/12/15 0515 06/13/15 0429  WBC 7.2 7.3 7.3 6.4  NEUTROABS 4.9  --   --   --   HGB 7.1* 7.5* 7.2* 7.2*  HCT 24.4* 25.0* 24.0* 23.8*  MCV 87.8 88.3 87.6 88.1  PLT 344 309 313 293   Cardiac Enzymes:  Recent Labs Lab 06/09/15 0847  TROPONINI <0.03   BNP: BNP (last 3 results)  Recent Labs  06/08/15 2200  BNP 154.3*    ProBNP (last 3 results) No results for input(s): PROBNP in the last 8760 hours.  CBG:  Recent Labs Lab 06/12/15 1142 06/12/15 1618 06/12/15 2111 06/13/15 0730 06/13/15 1132  GLUCAP 184* 134* 125* 128* 172*       Signed:  Vernell Leep, MD, FACP, FHM. [facility] Pager [phone]  If 7PM-7AM, please contact night-coverage [email or web address] Password [facility] 06/13/2015, 12:04 PM

## 2015-06-13 NOTE — Progress Notes (Signed)
Patient ID: Sheri Pearson, female   DOB: 01-22-1946, 70 y.o.   MRN: [id number] S:No new complaints O:BP 165/50 mmHg  Pulse 70  Temp(Src) 98.2 F (36.8 C) (Oral)  Resp 16  Ht 5\' 6"  (1.676 m)  Wt 129.241 kg (284 lb 14.8 oz)  BMI 46.01 kg/m2  SpO2 97%  Intake/Output Summary (Last 24 hours) at 06/13/15 0911 Last data filed at 06/13/15 0100  Gross per 24 hour  Intake    600 ml  Output    900 ml  Net   -300 ml   Intake/Output: I/O last 3 completed shifts: In: 1457.2 [P.O.:1260; I.V.:197.2] Out: 1950 [id number]  Intake/Output this shift:    Weight change: 0.741 kg (1 lb 10.1 oz) Gen:WD obese AAF in NAD CVS:no rub Resp:cta [id number] Ext:no edema   Recent Labs Lab 06/08/15 2002 06/09/15 0847 06/12/15 0515 06/13/15 0429  NA 140 140 139 139  K 4.9 4.8 4.9 4.9  CL 109 111 111 111  CO2 24 21* 21* 20*  GLUCOSE 117* 127* 129* 118*  BUN 40* 39* 44* 45*  CREATININE 2.78* 2.63* 3.02* 3.00*  ALBUMIN 2.9*  --   --   --   CALCIUM 9.0 8.8* 8.4* 8.6*  AST 16  --   --   --   ALT 11*  --   --   --    Liver Function Tests:  Recent Labs Lab 06/08/15 2002  AST 16  ALT 11*  ALKPHOS 51  BILITOT 0.4  PROT 9.9*  ALBUMIN 2.9*   No results for input(s): LIPASE, AMYLASE in the last 168 hours. No results for input(s): AMMONIA in the last 168 hours. CBC:  Recent Labs Lab 06/08/15 2002 06/09/15 0847 06/12/15 0515 06/13/15 0429  WBC 7.2 7.3 7.3 6.4  NEUTROABS 4.9  --   --   --   HGB 7.1* 7.5* 7.2* 7.2*  HCT 24.4* 25.0* 24.0* 23.8*  MCV 87.8 88.3 87.6 88.1  PLT 344 309 313 293   Cardiac Enzymes:  Recent Labs Lab 06/09/15 0847  TROPONINI <0.03   CBG:  Recent Labs Lab 06/12/15 0756 06/12/15 1142 06/12/15 1618 06/12/15 2111 06/13/15 0730  GLUCAP 134* 184* 134* 125* 128*    Iron Studies: No results for input(s): IRON, TIBC, TRANSFERRIN, FERRITIN in the last 72 hours. Studies/Results: Dg Chest Port 1 View  06/11/2015  CLINICAL DATA:  Status post  bronchoscopy with biopsy. EXAM: PORTABLE CHEST 1 VIEW COMPARISON:  Chest x-ray and chest CT scan of June 08, 2015 FINDINGS: The lungs are slightly less well inflated today. There is no pneumothorax, pneumomediastinum, or pleural effusion. The pulmonary interstitial markings remain increased. The cardiac silhouette is top-normal to mildly enlarged but stable. The pulmonary vascularity is mildly engorged. The mediastinum is normal in width. IMPRESSION: There is no postprocedure complication following bronchoscopy. There are persistent patchy interstitial and alveolar airspace opacities bilaterally. Stable mild cardiomegaly with mild central pulmonary vascular prominence. Electronically Signed   By: David  Martinique M.D.   On: 06/11/2015 11:28   Dg C-arm Bronchoscopy  06/11/2015  CLINICAL DATA:  C-ARM BRONCHOSCOPY Fluoroscopy was utilized by the requesting physician.  No radiographic interpretation.   . carvedilol  25 mg Oral BID WC  . darbepoetin (ARANESP) injection - NON-DIALYSIS  50 mcg Subcutaneous Q Fri-1800  . ferrous sulfate  325 mg Oral BID WC  . ferumoxytol  510 mg Intravenous Weekly  . heparin  5,000 Units Subcutaneous 3 times per day  . hydrALAZINE  25 mg Oral 3  times per day  . insulin aspart  0-9 Units Subcutaneous TID WC  . levofloxacin (LEVAQUIN) IV  750 mg Intravenous Q48H  . levothyroxine  112 mcg Oral QAC breakfast  . omega-3 acid ethyl esters  1 g Oral Daily  . rosuvastatin  10 mg Oral QHS  . verapamil  120 mg Oral BID  . vitamin B-12  2,500 mcg Oral Q1200    BMET    Component Value Date/Time   NA 139 06/13/2015 0429   K 4.9 06/13/2015 0429   CL 111 06/13/2015 0429   CO2 20* 06/13/2015 0429   GLUCOSE 118* 06/13/2015 0429   BUN 45* 06/13/2015 0429   CREATININE 3.00* 06/13/2015 0429   CALCIUM 8.6* 06/13/2015 0429   GFRNONAA 15* 06/13/2015 0429   GFRAA 17* 06/13/2015 0429   CBC    Component Value Date/Time   WBC 6.4 06/13/2015 0429   RBC 2.70* 06/13/2015 0429   RBC  2.81* 06/09/2015 0057   HGB 7.2* 06/13/2015 0429   HCT 23.8* 06/13/2015 0429   PLT 293 06/13/2015 0429   MCV 88.1 06/13/2015 0429   MCH 26.7 06/13/2015 0429   MCHC 30.3 06/13/2015 0429   RDW 16.0* 06/13/2015 0429   LYMPHSABS 1.6 06/08/2015 2002   MONOABS 0.5 06/08/2015 2002   EOSABS 0.2 06/08/2015 2002   BASOSABS 0.0 06/08/2015 2002     Assessment/Plan: 1. AKI/CKD stage 4- following IV diuresis but no significant change from her baseline Scr. Scr has improved slightly after holding lasix.   1. Scr stable and not much off from baseline 2. Resume outpatient lasix dose tomorrow 3. Follow up with Dr. Florene Glen (needs to reschedule as she missed her appointment on 06/11/15 4. Will arrange for outpatient labs next week  2. Dyspnea- multifactorial and improved after diuresis. 3. Pulmonary nodules- Diffuse Bilateral Pulmonary Nodules - unchanged from prior CT scan in June 2016, concern for autoimmune process vs malignancy, s/p bronchoscopy and evaluated by PCCM. No elevated WBC, fever, or evidence of infection. F/u bronch results with PCCM 4. HTN- has been labile at times, under better control at this time 5. DM- per primary svc 6. Anemia of chronic disease and iron deficiency- will plan for IV iron and initiation of ESA therapy 7. Hypothyroidism- on replacement therapy 8. Hyperlipidemia- on statin 9. Morbid obesity- stressed the importance of diet and weight loss 10. Disposition- stable for discharge  [name]

## 2015-06-13 NOTE — Discharge Instructions (Signed)
Hypoxemia Hypoxemia occurs when your blood does not contain enough oxygen. The body cannot work well when it does not have enough oxygen because every part of your body needs oxygen. Oxygen travels to all parts of the body through your blood. Hypoxemia can develop suddenly or can come on slowly. CAUSES Some common causes of hypoxemia include:  Long-term (chronic) lung diseases, such as chronic obstructive pulmonary disease (COPD) or interstitial lung disease.  Disorders that affect breathing at night, such as sleep apnea.  Fluid buildup in your lungs (pulmonary edema).  Lung infection (pneumonia).  Lung or throat cancer.  Abnormal blood flow that bypasses the lungs (shunt).  Certain diseasesthat affect nerves or muscles.  A collapsed lung (pneumothorax).  A blood clot in the lungs (pulmonary embolus).  Certain types of heart disease.  Slow or shallow breathing (hypoventilation).  Certain medicines.  High altitudes.  Toxic chemicals and gases. SIGNS AND SYMPTOMS Not everyone who has hypoxemia will develop symptoms. If the hypoxemia developed quickly, you will likely have symptoms such as shortness of breath. If the hypoxemia came on slowly over months or years, you may not notice any symptoms. Symptoms can include:  Shortness of breath (dyspnea).  Bluish color of the skin, lips, or nail beds.  Breathing that is fast, noisy, or shallow.  A fast heartbeat.  Feeling tired or sleepy.  Being confused or feeling anxious. DIAGNOSIS To determine if you have hypoxemia, your health care provider may perform:  A physical exam.  Blood tests.  A pulse oximetry. A sensor will be put on your finger, toe, or earlobe to measure the percent of oxygen in your blood. TREATMENT You will likely be treated with oxygen therapy. Depending on the cause of your hypoxemia, you may need oxygen for a short time (weeks or months), or you may need it indefinitely. Your health care provider  may also recommend other therapies to treat the underlying cause of your hypoxemia. HOME CARE INSTRUCTIONS  Only take over-the-counter or prescription medicines as directed by your health care provider.  Follow oxygen safety measures if you are on oxygen therapy. These may include:  Always having a backup supply of oxygen.  Not allowing anyone to smoke around oxygen.  Handling the oxygen tanks carefully and as instructed.  If you smoke, quit. Stay away from people who smoke.  Follow up with your health care provider as directed. SEEK MEDICAL CARE IF:  You have any concerns about your oxygen therapy.  You still have trouble breathing.  You become short of breath when you exercise.  You are tired when you wake up.  You have a headache when you wake up. SEEK IMMEDIATE MEDICAL CARE IF:   Your breathing gets worse.  You have new shortness of breath with normal activity.  You have a bluish color of the skin, lips, or nail beds.  You have confusion or cloudy thinking.  You cough up dark mucus.  You have chest pain.  You have a fever. MAKE SURE YOU:  Understand these instructions.  Will watch your condition.  Will get help right away if you are not doing well or get worse.   This information is not intended to replace advice given to you by your health care provider. Make sure you discuss any questions you have with your health care provider.   Document Released: 11/15/2010 Document Revised: 05/07/2013 Document Reviewed: 11/29/2012 Elsevier Interactive Patient Education 2016 Elsevier Inc.  Chronic Kidney Disease Chronic kidney disease occurs when the kidneys are  damaged over a long period. The kidneys are two organs that lie on either side of the spine between the middle of the back and the front of the abdomen. The kidneys:  Remove wastes and extra water from the blood.  Produce important hormones. These help keep bones strong, regulate blood pressure, and help  create red blood cells.  Balance the fluids and chemicals in the blood and tissues. A small amount of kidney damage may not cause problems, but a large amount of damage may make it difficult or impossible for the kidneys to work the way they should. If steps are not taken to slow down the kidney damage or stop it from getting worse, the kidneys may stop working permanently. Most of the time, chronic kidney disease does not go away. However, it can often be controlled, and those with the disease can usually live normal lives. CAUSES The most common causes of chronic kidney disease are diabetes and high blood pressure (hypertension). Chronic kidney disease may also be caused by:  Diseases that cause the kidneys' filters to become inflamed.  Diseases that affect the immune system.  Genetic diseases.  Medicines that damage the kidneys, such as anti-inflammatory medicines.  Poisoning or exposure to toxic substances.  A reoccurring kidney or urinary infection.  A problem with urine flow. This may be caused by:  Cancer.  Kidney stones.  An enlarged prostate in males. SIGNS AND SYMPTOMS Because the kidney damage in chronic kidney disease occurs slowly, symptoms develop slowly and may not be obvious until the kidney damage becomes severe. A person may have a kidney disease for years without showing any symptoms. Symptoms can include:  Swelling (edema) of the legs, ankles, or feet.  Tiredness (lethargy).  Nausea or vomiting.  Confusion.  Problems with urination, such as:  Decreased urine production.  Frequent urination, especially at night.  Frequent accidents in children who are potty trained.  Muscle twitches and cramps.  Shortness of breath.  Weakness.  Persistent itchiness.  Loss of appetite.  Metallic taste in the mouth.  Trouble sleeping.  Slowed development in children.  Short stature in children. DIAGNOSIS Chronic kidney disease may be detected and  diagnosed by tests, including blood, urine, imaging, or kidney biopsy tests. TREATMENT Most chronic kidney diseases cannot be cured. Treatment usually involves relieving symptoms and preventing or slowing the progression of the disease. Treatment may include:  A special diet. You may need to avoid alcohol and foods thatare salty and high in potassium.  Medicines. These may:  Lower blood pressure.  Relieve anemia.  Relieve swelling.  Protect the bones. HOME CARE INSTRUCTIONS  Follow your prescribed diet. Your health care provider may instruct you to limit daily salt (sodium) and protein intake.  Take medicines only as directed by your health care provider. Do not take any new medicines (prescription, over-the-counter, or nutritional supplements) unless approved by your health care provider. Many medicines can worsen your kidney damage or need to have the dose adjusted.   Quit smoking if you smoke. Talk to your health care provider about a smoking cessation program.  Keep all follow-up visits as directed by your health care provider.  Monitor your blood pressure.  Start or continue an exercise plan.  Get immunizations as directed by your health care provider.  Take vitamin and mineral supplements as directed by your health care provider. SEEK IMMEDIATE MEDICAL CARE IF:  Your symptoms get worse or you develop new symptoms.  You develop symptoms of end-stage kidney disease.  These include:  Headaches.  Abnormally dark or light skin.  Numbness in the hands or feet.  Easy bruising.  Frequent hiccups.  Menstruation stops.  You have a fever.  You have decreased urine production.  You havepain or bleeding when urinating. MAKE SURE YOU:  Understand these instructions.  Will watch your condition.  Will get help right away if you are not doing well or get worse. FOR MORE INFORMATION   American Association of Kidney Patients: BombTimer.gl  National Kidney  Foundation: www.kidney.[place]: https://mathis.com/  [facility]: www.lifeoptions.org and www.kidneyschool.org   This information is not intended to replace advice given to you by your health care provider. Make sure you discuss any questions you have with your health care provider.   Document Released: 02/09/2008 Document Revised: 05/23/2014 Document Reviewed: 12/30/2011 Elsevier Interactive Patient Education [employer].

## 2015-06-14 LAB — CULTURE, BLOOD (ROUTINE X 2)
Culture: NO GROWTH
Culture: NO GROWTH

## 2015-06-14 LAB — CULTURE, BAL-QUANTITATIVE W GRAM STAIN
Colony Count: 25000
Culture: NORMAL

## 2015-06-14 LAB — CULTURE, BAL-QUANTITATIVE

## 2015-06-15 NOTE — Progress Notes (Signed)
Late entry:    .SATURATION QUALIFICATIONS: (This note is used to comply with regulatory documentation for home oxygen)  Patient Saturations on Room Air at Rest = 100%  Patient Saturations on Room Air while Ambulating = 97%  Please briefly explain why patient needs home oxygen:  No need for home O2

## 2015-06-16 ENCOUNTER — Ambulatory Visit (INDEPENDENT_AMBULATORY_CARE_PROVIDER_SITE_OTHER): Payer: Self-pay | Admitting: Emergency Medicine

## 2015-06-16 ENCOUNTER — Encounter: Payer: Self-pay | Admitting: Emergency Medicine

## 2015-06-16 VITALS — BP 130/80 | HR 71 | Ht 66.0 in | Wt 274.0 lb

## 2015-06-16 DIAGNOSIS — R918 Other nonspecific abnormal finding of lung field: Secondary | ICD-10-CM

## 2015-06-16 NOTE — Patient Instructions (Signed)
We will repeat your CT scan of the chest in April 2017 to look for interval change. Depending on the results we may decide to pursue a repeat biopsy of your lung.  Please follow with Dr. Lamonte Sakai in April after the scan to review

## 2015-06-16 NOTE — Assessment & Plan Note (Signed)
Transbronchial biopsies and brushings showed no evidence of malignancy. The results were nonspecific. I do not have an etiology at this time for her bilateral nodular opacities but I suspect either cryptogenic organizing pneumonia or some other inflammatory infiltrating process. She's been treated for bacterial pneumonia without any significant change. We discussed the options at this time which include empiric prednisone treatment and following a repeat CT scan. We also discussed watchful waiting with a repeat CT in 3 months and then a decision about either repeat biopsy or empiric therapy. She decided to repeat the CT in 3 months and to decide next steps based on interval change. I will follow up with her in April to review the scan

## 2015-06-16 NOTE — Progress Notes (Signed)
Subjective:    Patient ID: Sheri Pearson, female    DOB: 1945/07/18, 70 y.o.   MRN: [id number]  HPI  70 year old woman, never smoker seen in follow-up from recent hospitalization for dyspnea and bilateral patchy infiltrates on CT scan of the chest.   Prior admission in 11/2014 with abnormal chest xray / CT (diffuse bilateral nodular airspace disease), malignant hypertension, fever to 102.5 in setting of UTI and AKI. ESR 137 (11/10/14). Seen in office by Dr. Melvyn Novas with follow up CXR that was unchanged but no follow up after 11/2014. Labs at that time - ANCA negative, GBM AB negative, C3 wnl, C4 47 (upper limit 44), UA with small Hgb, protein electrophoresis Gamma Globulin 3.0, total globulin 5.3, ANA negative, HIV non-reactive, and quantiferon gold negative.   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 She is a never smoker. She has known about her renal disease since 2009. Her father and brothers also had kidney disease. She reports she has been more tired since before Christmas 2016 which is unusual for her. She continues to work full time. Since the beginning of January, she has noted progressively worsening shortness of breath - unable to ambulate normal distances without stopping for a rest break. Occasional dry cough. The week prior to admission, she has been unable to lie flat - 3 pillow orthopnea, 10 lb weight gain and lower extremity swelling. She denies known fevers, chills, nausea / vomiting. She was started on lasix prior to admit per Dr. Florene Glen which helped with dyspnea.   The patient was admitted per [facility] for further evaluation of SOB. Initial evaluation notable for CXR with diffuse bilateral airspace disease. Follow up CT demonstrated diffuse bilateral nodular opacities that were essentially unchanged from 10/2014. Labs - Na 140, K 4.9, Cr 2.78, glucose 117, BNP 154, troponin 0.0, WBC 7.2 (normal eosinophil count), Hgb 7.1, and platelets  344.   She underwent bronchoscopy on 06/11/15 with transbronchial brushings and biopsies. She presents today to discuss the results. I reviewed the results personally and there is no evidence of malignancy on either the pathology or the cytology. She reports that she is doing well after the bronchoscopy no new issues.   Review of Systems As per HPI     Objective:   Physical Exam Filed Vitals:   06/16/15 1514 06/16/15 1515  BP:  130/80  Pulse:  71  Height: _0  (1.676 m)   Weight: 274 lb (124.286 kg)   SpO2:  98%  Gen: Pleasant, well-nourished, in no distress,  normal affect  ENT: No lesions,  mouth clear,  oropharynx clear, no postnasal drip  Neck: No JVD, no TMG, no carotid bruits  Lungs: No use of accessory muscles, no dullness to percussion, clear without rales or rhonchi  Cardiovascular: RRR, heart sounds normal, no murmur or gallops, no peripheral edema  Musculoskeletal: No deformities, no cyanosis or clubbing  Neuro: alert, non focal  Skin: Warm, no lesions or rashes   06/08/15 --  COMPARISON: Chest radiograph performed earlier today at 7:57 p.m., and CT of the chest performed 11/08/2014  FINDINGS: Prominent bilateral nodular airspace opacities are seen, of varying size and scattered throughout both lungs. The distribution and size of these opacities is essentially unchanged from the prior study in June 2016. It is unusual for opacities to remain unchanged for 7 months; malignancy is considered unlikely, and atypical infection relatively unlikely. An inflammatory condition such as sarcoidosis might have such an  appearance, though the relative lack of lymphadenopathy suggests against sarcoidosis.  No significant pleural effusion or pneumothorax is seen.  The heart is mildly enlarged. Trace pericardial fluid remains within normal limits. Visualized mediastinal nodes remain normal in size. The hila are difficult to fully assess due to surrounding  opacities. The great vessels are grossly unremarkable in appearance. The visualized portions of the thyroid gland are unremarkable. No axillary lymphadenopathy is seen.  The visualized portions of the liver and spleen are unremarkable. The visualized portions of the pancreas, adrenal glands and kidneys are within normal limits. Nonspecific bilateral perinephric stranding is noted.  No acute osseous abnormalities are identified. Anterior bridging osteophytes are noted along the mid thoracic spine. A stable focal 1.2 cm lucency is noted along the right side of the manubrium.  IMPRESSION: 1. Prominent bilateral nodular airspace opacities, of varying size and scattered throughout both lungs, are essentially unchanged from the prior study in June 2016. It is unusual for opacities to remain entirely unchanged for 7 months. Malignancy is considered unlikely, and atypical infection relatively unlikely. An inflammatory condition such as sarcoidosis might have such an appearance, though the relative lack of adenopathy suggests against sarcoidosis. Pulmonary consultation would be helpful. 2. Mild cardiomegaly noted. 3. Stable focal 1.2 cm lucency along the right side of the manubrium, nonspecific in appearance.      Assessment & Plan:  Pulmonary nodules/lesions, multiple Transbronchial biopsies and brushings showed no evidence of malignancy. The results were nonspecific. I do not have an etiology at this time for her bilateral nodular opacities but I suspect either cryptogenic organizing pneumonia or some other inflammatory infiltrating process. She's been treated for bacterial pneumonia without any significant change. We discussed the options at this time which include empiric prednisone treatment and following a repeat CT scan. We also discussed watchful waiting with a repeat CT in 3 months and then a decision about either repeat biopsy or empiric therapy. She decided to repeat the CT in 3  months and to decide next steps based on interval change. I will follow up with her in April to review the scan

## 2015-07-08 LAB — FUNGUS CULTURE W SMEAR: Fungal Smear: NONE SEEN

## 2015-07-24 LAB — AFB CULTURE WITH SMEAR (NOT AT ARMC): ACID FAST SMEAR: NONE SEEN

## 2015-07-25 DIAGNOSIS — M7062 Trochanteric bursitis, left hip: Secondary | ICD-10-CM | POA: Diagnosis not present

## 2015-07-28 DIAGNOSIS — E039 Hypothyroidism, unspecified: Secondary | ICD-10-CM | POA: Diagnosis present

## 2015-07-28 DIAGNOSIS — D631 Anemia in chronic kidney disease: Secondary | ICD-10-CM | POA: Diagnosis present

## 2015-07-28 DIAGNOSIS — D649 Anemia, unspecified: Secondary | ICD-10-CM | POA: Diagnosis not present

## 2015-07-28 DIAGNOSIS — E119 Type 2 diabetes mellitus without complications: Secondary | ICD-10-CM | POA: Diagnosis not present

## 2015-07-28 DIAGNOSIS — N390 Urinary tract infection, site not specified: Secondary | ICD-10-CM | POA: Diagnosis not present

## 2015-07-28 DIAGNOSIS — I10 Essential (primary) hypertension: Secondary | ICD-10-CM | POA: Diagnosis not present

## 2015-07-28 DIAGNOSIS — N184 Chronic kidney disease, stage 4 (severe): Secondary | ICD-10-CM | POA: Diagnosis present

## 2015-07-28 DIAGNOSIS — K2970 Gastritis, unspecified, without bleeding: Secondary | ICD-10-CM | POA: Diagnosis not present

## 2015-07-28 DIAGNOSIS — R0902 Hypoxemia: Secondary | ICD-10-CM | POA: Diagnosis present

## 2015-07-28 DIAGNOSIS — K2980 Duodenitis without bleeding: Secondary | ICD-10-CM | POA: Diagnosis not present

## 2015-07-28 DIAGNOSIS — R7989 Other specified abnormal findings of blood chemistry: Secondary | ICD-10-CM | POA: Diagnosis not present

## 2015-07-28 DIAGNOSIS — K2950 Unspecified chronic gastritis without bleeding: Secondary | ICD-10-CM | POA: Diagnosis present

## 2015-07-28 DIAGNOSIS — M71552 Other bursitis, not elsewhere classified, left hip: Secondary | ICD-10-CM | POA: Diagnosis present

## 2015-07-28 DIAGNOSIS — I130 Hypertensive heart and chronic kidney disease with heart failure and stage 1 through stage 4 chronic kidney disease, or unspecified chronic kidney disease: Secondary | ICD-10-CM | POA: Diagnosis present

## 2015-07-28 DIAGNOSIS — R0602 Shortness of breath: Secondary | ICD-10-CM | POA: Diagnosis not present

## 2015-07-28 DIAGNOSIS — R262 Difficulty in walking, not elsewhere classified: Secondary | ICD-10-CM | POA: Diagnosis not present

## 2015-07-28 DIAGNOSIS — E785 Hyperlipidemia, unspecified: Secondary | ICD-10-CM | POA: Diagnosis not present

## 2015-07-28 DIAGNOSIS — Z6841 Body Mass Index (BMI) 40.0 and over, adult: Secondary | ICD-10-CM | POA: Diagnosis not present

## 2015-07-28 DIAGNOSIS — I509 Heart failure, unspecified: Secondary | ICD-10-CM | POA: Diagnosis not present

## 2015-07-28 DIAGNOSIS — M6281 Muscle weakness (generalized): Secondary | ICD-10-CM | POA: Diagnosis not present

## 2015-07-28 DIAGNOSIS — M2550 Pain in unspecified joint: Secondary | ICD-10-CM | POA: Diagnosis not present

## 2015-07-28 DIAGNOSIS — N179 Acute kidney failure, unspecified: Secondary | ICD-10-CM | POA: Diagnosis present

## 2015-07-28 DIAGNOSIS — F339 Major depressive disorder, recurrent, unspecified: Secondary | ICD-10-CM | POA: Diagnosis not present

## 2015-07-28 DIAGNOSIS — K219 Gastro-esophageal reflux disease without esophagitis: Secondary | ICD-10-CM | POA: Diagnosis not present

## 2015-07-28 DIAGNOSIS — J9691 Respiratory failure, unspecified with hypoxia: Secondary | ICD-10-CM | POA: Diagnosis not present

## 2015-07-28 DIAGNOSIS — R1110 Vomiting, unspecified: Secondary | ICD-10-CM | POA: Diagnosis not present

## 2015-07-28 DIAGNOSIS — I5031 Acute diastolic (congestive) heart failure: Secondary | ICD-10-CM | POA: Diagnosis present

## 2015-07-28 DIAGNOSIS — T402X5A Adverse effect of other opioids, initial encounter: Secondary | ICD-10-CM | POA: Diagnosis not present

## 2015-07-28 DIAGNOSIS — R0600 Dyspnea, unspecified: Secondary | ICD-10-CM | POA: Diagnosis not present

## 2015-07-28 DIAGNOSIS — Z794 Long term (current) use of insulin: Secondary | ICD-10-CM | POA: Diagnosis not present

## 2015-07-28 DIAGNOSIS — R195 Other fecal abnormalities: Secondary | ICD-10-CM | POA: Diagnosis present

## 2015-07-28 DIAGNOSIS — B9689 Other specified bacterial agents as the cause of diseases classified elsewhere: Secondary | ICD-10-CM | POA: Diagnosis present

## 2015-07-28 DIAGNOSIS — E1122 Type 2 diabetes mellitus with diabetic chronic kidney disease: Secondary | ICD-10-CM | POA: Diagnosis present

## 2015-08-10 DIAGNOSIS — I10 Essential (primary) hypertension: Secondary | ICD-10-CM | POA: Diagnosis not present

## 2015-08-10 DIAGNOSIS — R5381 Other malaise: Secondary | ICD-10-CM | POA: Diagnosis not present

## 2015-08-10 DIAGNOSIS — F339 Major depressive disorder, recurrent, unspecified: Secondary | ICD-10-CM | POA: Diagnosis not present

## 2015-08-10 DIAGNOSIS — R262 Difficulty in walking, not elsewhere classified: Secondary | ICD-10-CM | POA: Diagnosis not present

## 2015-08-10 DIAGNOSIS — N184 Chronic kidney disease, stage 4 (severe): Secondary | ICD-10-CM | POA: Diagnosis not present

## 2015-08-10 DIAGNOSIS — I509 Heart failure, unspecified: Secondary | ICD-10-CM | POA: Diagnosis not present

## 2015-08-10 DIAGNOSIS — K219 Gastro-esophageal reflux disease without esophagitis: Secondary | ICD-10-CM | POA: Diagnosis not present

## 2015-08-10 DIAGNOSIS — N390 Urinary tract infection, site not specified: Secondary | ICD-10-CM | POA: Diagnosis not present

## 2015-08-10 DIAGNOSIS — D649 Anemia, unspecified: Secondary | ICD-10-CM | POA: Diagnosis not present

## 2015-08-10 DIAGNOSIS — E039 Hypothyroidism, unspecified: Secondary | ICD-10-CM | POA: Diagnosis not present

## 2015-08-10 DIAGNOSIS — E119 Type 2 diabetes mellitus without complications: Secondary | ICD-10-CM | POA: Diagnosis not present

## 2015-08-10 DIAGNOSIS — I5031 Acute diastolic (congestive) heart failure: Secondary | ICD-10-CM | POA: Diagnosis not present

## 2015-08-10 DIAGNOSIS — R42 Dizziness and giddiness: Secondary | ICD-10-CM | POA: Diagnosis not present

## 2015-08-10 DIAGNOSIS — M6281 Muscle weakness (generalized): Secondary | ICD-10-CM | POA: Diagnosis not present

## 2015-08-10 DIAGNOSIS — I5033 Acute on chronic diastolic (congestive) heart failure: Secondary | ICD-10-CM | POA: Diagnosis not present

## 2015-08-10 DIAGNOSIS — E785 Hyperlipidemia, unspecified: Secondary | ICD-10-CM | POA: Diagnosis not present

## 2015-08-11 DIAGNOSIS — E119 Type 2 diabetes mellitus without complications: Secondary | ICD-10-CM | POA: Diagnosis not present

## 2015-08-11 DIAGNOSIS — I5033 Acute on chronic diastolic (congestive) heart failure: Secondary | ICD-10-CM | POA: Diagnosis not present

## 2015-08-11 DIAGNOSIS — R5381 Other malaise: Secondary | ICD-10-CM | POA: Diagnosis not present

## 2015-08-11 DIAGNOSIS — R42 Dizziness and giddiness: Secondary | ICD-10-CM | POA: Diagnosis not present

## 2015-08-28 ENCOUNTER — Other Ambulatory Visit: Payer: Medicare Other

## 2015-09-04 ENCOUNTER — Ambulatory Visit: Payer: Medicare Other | Admitting: Emergency Medicine

## 2015-09-04 ENCOUNTER — Inpatient Hospital Stay: Admission: RE | Admit: 2015-09-04 | Payer: Medicare Other | Source: Ambulatory Visit

## 2015-10-01 DIAGNOSIS — N179 Acute kidney failure, unspecified: Secondary | ICD-10-CM | POA: Diagnosis not present

## 2015-10-01 DIAGNOSIS — N183 Chronic kidney disease, stage 3 (moderate): Secondary | ICD-10-CM | POA: Diagnosis not present

## 2015-10-01 DIAGNOSIS — I10 Essential (primary) hypertension: Secondary | ICD-10-CM | POA: Diagnosis not present

## 2015-11-06 ENCOUNTER — Ambulatory Visit: Payer: Medicare Other | Admitting: Emergency Medicine

## 2015-11-06 ENCOUNTER — Inpatient Hospital Stay: Admission: RE | Admit: 2015-11-06 | Payer: Medicare Other | Source: Ambulatory Visit

## 2016-03-10 DIAGNOSIS — W19XXXA Unspecified fall, initial encounter: Secondary | ICD-10-CM | POA: Diagnosis not present

## 2016-03-10 DIAGNOSIS — E784 Other hyperlipidemia: Secondary | ICD-10-CM | POA: Diagnosis not present

## 2016-03-10 DIAGNOSIS — N184 Chronic kidney disease, stage 4 (severe): Secondary | ICD-10-CM | POA: Diagnosis not present

## 2016-03-10 DIAGNOSIS — E039 Hypothyroidism, unspecified: Secondary | ICD-10-CM | POA: Diagnosis not present

## 2016-03-10 DIAGNOSIS — E119 Type 2 diabetes mellitus without complications: Secondary | ICD-10-CM | POA: Diagnosis not present

## 2016-03-10 DIAGNOSIS — E559 Vitamin D deficiency, unspecified: Secondary | ICD-10-CM | POA: Diagnosis not present

## 2016-03-10 DIAGNOSIS — I10 Essential (primary) hypertension: Secondary | ICD-10-CM | POA: Diagnosis not present

## 2016-04-18 DIAGNOSIS — Z1231 Encounter for screening mammogram for malignant neoplasm of breast: Secondary | ICD-10-CM | POA: Diagnosis not present

## 2016-05-03 DIAGNOSIS — M6281 Muscle weakness (generalized): Secondary | ICD-10-CM | POA: Diagnosis not present

## 2016-05-03 DIAGNOSIS — M79605 Pain in left leg: Secondary | ICD-10-CM | POA: Diagnosis not present

## 2016-05-03 DIAGNOSIS — R262 Difficulty in walking, not elsewhere classified: Secondary | ICD-10-CM | POA: Diagnosis not present

## 2016-05-05 DIAGNOSIS — M79605 Pain in left leg: Secondary | ICD-10-CM | POA: Diagnosis not present

## 2016-05-05 DIAGNOSIS — M6281 Muscle weakness (generalized): Secondary | ICD-10-CM | POA: Diagnosis not present

## 2016-05-05 DIAGNOSIS — R262 Difficulty in walking, not elsewhere classified: Secondary | ICD-10-CM | POA: Diagnosis not present

## 2016-05-06 DIAGNOSIS — R42 Dizziness and giddiness: Secondary | ICD-10-CM | POA: Diagnosis not present

## 2016-05-06 DIAGNOSIS — Z1211 Encounter for screening for malignant neoplasm of colon: Secondary | ICD-10-CM | POA: Diagnosis not present

## 2016-05-06 DIAGNOSIS — N184 Chronic kidney disease, stage 4 (severe): Secondary | ICD-10-CM | POA: Diagnosis not present

## 2016-05-06 DIAGNOSIS — E784 Other hyperlipidemia: Secondary | ICD-10-CM | POA: Diagnosis not present

## 2016-05-06 DIAGNOSIS — I10 Essential (primary) hypertension: Secondary | ICD-10-CM | POA: Diagnosis not present

## 2016-05-06 DIAGNOSIS — D509 Iron deficiency anemia, unspecified: Secondary | ICD-10-CM | POA: Diagnosis not present

## 2016-05-06 DIAGNOSIS — E119 Type 2 diabetes mellitus without complications: Secondary | ICD-10-CM | POA: Diagnosis not present

## 2016-05-06 DIAGNOSIS — D649 Anemia, unspecified: Secondary | ICD-10-CM | POA: Diagnosis not present

## 2016-05-06 DIAGNOSIS — E039 Hypothyroidism, unspecified: Secondary | ICD-10-CM | POA: Diagnosis not present

## 2016-05-17 DIAGNOSIS — R262 Difficulty in walking, not elsewhere classified: Secondary | ICD-10-CM | POA: Diagnosis not present

## 2016-05-17 DIAGNOSIS — M79605 Pain in left leg: Secondary | ICD-10-CM | POA: Diagnosis not present

## 2016-05-17 DIAGNOSIS — M6281 Muscle weakness (generalized): Secondary | ICD-10-CM | POA: Diagnosis not present

## 2016-05-19 DIAGNOSIS — M6281 Muscle weakness (generalized): Secondary | ICD-10-CM | POA: Diagnosis not present

## 2016-05-19 DIAGNOSIS — R262 Difficulty in walking, not elsewhere classified: Secondary | ICD-10-CM | POA: Diagnosis not present

## 2016-05-19 DIAGNOSIS — M79605 Pain in left leg: Secondary | ICD-10-CM | POA: Diagnosis not present

## 2016-05-24 DIAGNOSIS — R262 Difficulty in walking, not elsewhere classified: Secondary | ICD-10-CM | POA: Diagnosis not present

## 2016-05-24 DIAGNOSIS — M6281 Muscle weakness (generalized): Secondary | ICD-10-CM | POA: Diagnosis not present

## 2016-05-24 DIAGNOSIS — M79605 Pain in left leg: Secondary | ICD-10-CM | POA: Diagnosis not present

## 2016-05-26 DIAGNOSIS — M6281 Muscle weakness (generalized): Secondary | ICD-10-CM | POA: Diagnosis not present

## 2016-05-26 DIAGNOSIS — M79605 Pain in left leg: Secondary | ICD-10-CM | POA: Diagnosis not present

## 2016-05-26 DIAGNOSIS — R262 Difficulty in walking, not elsewhere classified: Secondary | ICD-10-CM | POA: Diagnosis not present

## 2016-05-31 DIAGNOSIS — M6281 Muscle weakness (generalized): Secondary | ICD-10-CM | POA: Diagnosis not present

## 2016-05-31 DIAGNOSIS — R262 Difficulty in walking, not elsewhere classified: Secondary | ICD-10-CM | POA: Diagnosis not present

## 2016-05-31 DIAGNOSIS — M79605 Pain in left leg: Secondary | ICD-10-CM | POA: Diagnosis not present

## 2016-06-07 DIAGNOSIS — M79605 Pain in left leg: Secondary | ICD-10-CM | POA: Diagnosis not present

## 2016-06-07 DIAGNOSIS — M6281 Muscle weakness (generalized): Secondary | ICD-10-CM | POA: Diagnosis not present

## 2016-06-07 DIAGNOSIS — R262 Difficulty in walking, not elsewhere classified: Secondary | ICD-10-CM | POA: Diagnosis not present

## 2016-06-08 DIAGNOSIS — N184 Chronic kidney disease, stage 4 (severe): Secondary | ICD-10-CM | POA: Diagnosis not present

## 2016-06-08 DIAGNOSIS — I10 Essential (primary) hypertension: Secondary | ICD-10-CM | POA: Diagnosis not present

## 2016-06-08 DIAGNOSIS — E1165 Type 2 diabetes mellitus with hyperglycemia: Secondary | ICD-10-CM | POA: Diagnosis not present

## 2016-06-08 DIAGNOSIS — D649 Anemia, unspecified: Secondary | ICD-10-CM | POA: Diagnosis not present

## 2016-06-09 DIAGNOSIS — R262 Difficulty in walking, not elsewhere classified: Secondary | ICD-10-CM | POA: Diagnosis not present

## 2016-06-09 DIAGNOSIS — N184 Chronic kidney disease, stage 4 (severe): Secondary | ICD-10-CM | POA: Diagnosis not present

## 2016-06-09 DIAGNOSIS — M79605 Pain in left leg: Secondary | ICD-10-CM | POA: Diagnosis not present

## 2016-06-09 DIAGNOSIS — M6281 Muscle weakness (generalized): Secondary | ICD-10-CM | POA: Diagnosis not present

## 2016-06-10 DIAGNOSIS — E784 Other hyperlipidemia: Secondary | ICD-10-CM | POA: Diagnosis not present

## 2016-06-10 DIAGNOSIS — D509 Iron deficiency anemia, unspecified: Secondary | ICD-10-CM | POA: Diagnosis not present

## 2016-06-10 DIAGNOSIS — E039 Hypothyroidism, unspecified: Secondary | ICD-10-CM | POA: Diagnosis not present

## 2016-06-10 DIAGNOSIS — R42 Dizziness and giddiness: Secondary | ICD-10-CM | POA: Diagnosis not present

## 2016-06-10 DIAGNOSIS — E119 Type 2 diabetes mellitus without complications: Secondary | ICD-10-CM | POA: Diagnosis not present

## 2016-06-10 DIAGNOSIS — I10 Essential (primary) hypertension: Secondary | ICD-10-CM | POA: Diagnosis not present

## 2016-06-10 DIAGNOSIS — N184 Chronic kidney disease, stage 4 (severe): Secondary | ICD-10-CM | POA: Diagnosis not present

## 2016-06-14 DIAGNOSIS — M79605 Pain in left leg: Secondary | ICD-10-CM | POA: Diagnosis not present

## 2016-06-14 DIAGNOSIS — R262 Difficulty in walking, not elsewhere classified: Secondary | ICD-10-CM | POA: Diagnosis not present

## 2016-06-14 DIAGNOSIS — M6281 Muscle weakness (generalized): Secondary | ICD-10-CM | POA: Diagnosis not present

## 2016-06-21 DIAGNOSIS — M6281 Muscle weakness (generalized): Secondary | ICD-10-CM | POA: Diagnosis not present

## 2016-06-21 DIAGNOSIS — R262 Difficulty in walking, not elsewhere classified: Secondary | ICD-10-CM | POA: Diagnosis not present

## 2016-06-21 DIAGNOSIS — M79605 Pain in left leg: Secondary | ICD-10-CM | POA: Diagnosis not present

## 2016-06-23 DIAGNOSIS — R262 Difficulty in walking, not elsewhere classified: Secondary | ICD-10-CM | POA: Diagnosis not present

## 2016-06-23 DIAGNOSIS — M6281 Muscle weakness (generalized): Secondary | ICD-10-CM | POA: Diagnosis not present

## 2016-06-23 DIAGNOSIS — M79605 Pain in left leg: Secondary | ICD-10-CM | POA: Diagnosis not present

## 2016-06-28 DIAGNOSIS — M79605 Pain in left leg: Secondary | ICD-10-CM | POA: Diagnosis not present

## 2016-06-28 DIAGNOSIS — M6281 Muscle weakness (generalized): Secondary | ICD-10-CM | POA: Diagnosis not present

## 2016-06-28 DIAGNOSIS — R262 Difficulty in walking, not elsewhere classified: Secondary | ICD-10-CM | POA: Diagnosis not present

## 2016-06-30 DIAGNOSIS — M79605 Pain in left leg: Secondary | ICD-10-CM | POA: Diagnosis not present

## 2016-06-30 DIAGNOSIS — R262 Difficulty in walking, not elsewhere classified: Secondary | ICD-10-CM | POA: Diagnosis not present

## 2016-06-30 DIAGNOSIS — M6281 Muscle weakness (generalized): Secondary | ICD-10-CM | POA: Diagnosis not present

## 2016-07-05 DIAGNOSIS — R262 Difficulty in walking, not elsewhere classified: Secondary | ICD-10-CM | POA: Diagnosis not present

## 2016-07-05 DIAGNOSIS — M79605 Pain in left leg: Secondary | ICD-10-CM | POA: Diagnosis not present

## 2016-07-05 DIAGNOSIS — M6281 Muscle weakness (generalized): Secondary | ICD-10-CM | POA: Diagnosis not present

## 2016-07-07 DIAGNOSIS — M6281 Muscle weakness (generalized): Secondary | ICD-10-CM | POA: Diagnosis not present

## 2016-07-07 DIAGNOSIS — M79605 Pain in left leg: Secondary | ICD-10-CM | POA: Diagnosis not present

## 2016-07-07 DIAGNOSIS — R262 Difficulty in walking, not elsewhere classified: Secondary | ICD-10-CM | POA: Diagnosis not present

## 2016-07-12 DIAGNOSIS — M6281 Muscle weakness (generalized): Secondary | ICD-10-CM | POA: Diagnosis not present

## 2016-07-12 DIAGNOSIS — R262 Difficulty in walking, not elsewhere classified: Secondary | ICD-10-CM | POA: Diagnosis not present

## 2016-07-12 DIAGNOSIS — M79605 Pain in left leg: Secondary | ICD-10-CM | POA: Diagnosis not present

## 2016-07-14 DIAGNOSIS — N184 Chronic kidney disease, stage 4 (severe): Secondary | ICD-10-CM | POA: Diagnosis not present

## 2016-07-18 DIAGNOSIS — I10 Essential (primary) hypertension: Secondary | ICD-10-CM | POA: Diagnosis not present

## 2016-07-18 DIAGNOSIS — D649 Anemia, unspecified: Secondary | ICD-10-CM | POA: Diagnosis not present

## 2016-07-18 DIAGNOSIS — N184 Chronic kidney disease, stage 4 (severe): Secondary | ICD-10-CM | POA: Diagnosis not present

## 2016-07-18 DIAGNOSIS — E039 Hypothyroidism, unspecified: Secondary | ICD-10-CM | POA: Diagnosis not present

## 2016-07-18 DIAGNOSIS — E1165 Type 2 diabetes mellitus with hyperglycemia: Secondary | ICD-10-CM | POA: Diagnosis not present

## 2016-07-19 DIAGNOSIS — M79605 Pain in left leg: Secondary | ICD-10-CM | POA: Diagnosis not present

## 2016-07-19 DIAGNOSIS — R262 Difficulty in walking, not elsewhere classified: Secondary | ICD-10-CM | POA: Diagnosis not present

## 2016-07-19 DIAGNOSIS — M6281 Muscle weakness (generalized): Secondary | ICD-10-CM | POA: Diagnosis not present

## 2016-07-21 DIAGNOSIS — M6281 Muscle weakness (generalized): Secondary | ICD-10-CM | POA: Diagnosis not present

## 2016-07-21 DIAGNOSIS — M79605 Pain in left leg: Secondary | ICD-10-CM | POA: Diagnosis not present

## 2016-07-21 DIAGNOSIS — R262 Difficulty in walking, not elsewhere classified: Secondary | ICD-10-CM | POA: Diagnosis not present

## 2016-07-28 DIAGNOSIS — M79605 Pain in left leg: Secondary | ICD-10-CM | POA: Diagnosis not present

## 2016-07-28 DIAGNOSIS — R262 Difficulty in walking, not elsewhere classified: Secondary | ICD-10-CM | POA: Diagnosis not present

## 2016-07-28 DIAGNOSIS — M6281 Muscle weakness (generalized): Secondary | ICD-10-CM | POA: Diagnosis not present

## 2016-08-08 DIAGNOSIS — E119 Type 2 diabetes mellitus without complications: Secondary | ICD-10-CM | POA: Diagnosis not present

## 2016-08-08 DIAGNOSIS — J309 Allergic rhinitis, unspecified: Secondary | ICD-10-CM | POA: Diagnosis not present

## 2016-08-08 DIAGNOSIS — I10 Essential (primary) hypertension: Secondary | ICD-10-CM | POA: Diagnosis not present

## 2016-08-11 DIAGNOSIS — R262 Difficulty in walking, not elsewhere classified: Secondary | ICD-10-CM | POA: Diagnosis not present

## 2016-08-11 DIAGNOSIS — M79605 Pain in left leg: Secondary | ICD-10-CM | POA: Diagnosis not present

## 2016-08-11 DIAGNOSIS — M6281 Muscle weakness (generalized): Secondary | ICD-10-CM | POA: Diagnosis not present

## 2016-08-18 DIAGNOSIS — R262 Difficulty in walking, not elsewhere classified: Secondary | ICD-10-CM | POA: Diagnosis not present

## 2016-08-18 DIAGNOSIS — M79605 Pain in left leg: Secondary | ICD-10-CM | POA: Diagnosis not present

## 2016-08-18 DIAGNOSIS — M6281 Muscle weakness (generalized): Secondary | ICD-10-CM | POA: Diagnosis not present

## 2016-08-30 DIAGNOSIS — M79605 Pain in left leg: Secondary | ICD-10-CM | POA: Diagnosis not present

## 2016-08-30 DIAGNOSIS — R262 Difficulty in walking, not elsewhere classified: Secondary | ICD-10-CM | POA: Diagnosis not present

## 2016-08-30 DIAGNOSIS — M6281 Muscle weakness (generalized): Secondary | ICD-10-CM | POA: Diagnosis not present

## 2016-09-07 DIAGNOSIS — E784 Other hyperlipidemia: Secondary | ICD-10-CM | POA: Diagnosis not present

## 2016-09-07 DIAGNOSIS — E119 Type 2 diabetes mellitus without complications: Secondary | ICD-10-CM | POA: Diagnosis not present

## 2016-09-07 DIAGNOSIS — R42 Dizziness and giddiness: Secondary | ICD-10-CM | POA: Diagnosis not present

## 2016-09-07 DIAGNOSIS — N184 Chronic kidney disease, stage 4 (severe): Secondary | ICD-10-CM | POA: Diagnosis not present

## 2016-09-07 DIAGNOSIS — I10 Essential (primary) hypertension: Secondary | ICD-10-CM | POA: Diagnosis not present

## 2016-09-07 DIAGNOSIS — J309 Allergic rhinitis, unspecified: Secondary | ICD-10-CM | POA: Diagnosis not present

## 2016-09-07 DIAGNOSIS — D509 Iron deficiency anemia, unspecified: Secondary | ICD-10-CM | POA: Diagnosis not present

## 2016-09-07 DIAGNOSIS — E039 Hypothyroidism, unspecified: Secondary | ICD-10-CM | POA: Diagnosis not present

## 2016-10-06 DIAGNOSIS — I10 Essential (primary) hypertension: Secondary | ICD-10-CM | POA: Diagnosis not present

## 2016-10-06 DIAGNOSIS — E1165 Type 2 diabetes mellitus with hyperglycemia: Secondary | ICD-10-CM | POA: Diagnosis not present

## 2016-10-06 DIAGNOSIS — N184 Chronic kidney disease, stage 4 (severe): Secondary | ICD-10-CM | POA: Diagnosis not present

## 2016-10-06 DIAGNOSIS — E039 Hypothyroidism, unspecified: Secondary | ICD-10-CM | POA: Diagnosis not present

## 2016-10-06 DIAGNOSIS — N2581 Secondary hyperparathyroidism of renal origin: Secondary | ICD-10-CM | POA: Diagnosis not present

## 2016-12-19 DIAGNOSIS — I10 Essential (primary) hypertension: Secondary | ICD-10-CM | POA: Diagnosis not present

## 2016-12-19 DIAGNOSIS — E119 Type 2 diabetes mellitus without complications: Secondary | ICD-10-CM | POA: Diagnosis not present

## 2016-12-22 DIAGNOSIS — Z01818 Encounter for other preprocedural examination: Secondary | ICD-10-CM | POA: Diagnosis not present

## 2016-12-22 DIAGNOSIS — N189 Chronic kidney disease, unspecified: Secondary | ICD-10-CM | POA: Diagnosis not present

## 2017-01-30 DIAGNOSIS — N189 Chronic kidney disease, unspecified: Secondary | ICD-10-CM | POA: Diagnosis not present

## 2017-01-30 DIAGNOSIS — E039 Hypothyroidism, unspecified: Secondary | ICD-10-CM | POA: Diagnosis not present

## 2017-01-30 DIAGNOSIS — N2581 Secondary hyperparathyroidism of renal origin: Secondary | ICD-10-CM | POA: Diagnosis not present

## 2017-01-30 DIAGNOSIS — D649 Anemia, unspecified: Secondary | ICD-10-CM | POA: Diagnosis not present

## 2017-01-30 DIAGNOSIS — N184 Chronic kidney disease, stage 4 (severe): Secondary | ICD-10-CM | POA: Diagnosis not present

## 2017-01-30 DIAGNOSIS — E1165 Type 2 diabetes mellitus with hyperglycemia: Secondary | ICD-10-CM | POA: Diagnosis not present

## 2017-01-30 DIAGNOSIS — R42 Dizziness and giddiness: Secondary | ICD-10-CM | POA: Diagnosis not present

## 2017-01-30 DIAGNOSIS — E785 Hyperlipidemia, unspecified: Secondary | ICD-10-CM | POA: Diagnosis not present

## 2017-01-30 DIAGNOSIS — I10 Essential (primary) hypertension: Secondary | ICD-10-CM | POA: Diagnosis not present

## 2017-04-09 IMAGING — CT CT CHEST W/O CM
2 of 4 series · 15 of 36 positions shown, 18 images · non-contrast
Comparison: Chest radiograph performed earlier today at [DATE] p.m.,
and CT of the chest performed 11/08/2014

CLINICAL DATA: Subacute onset of shortness of breath and dyspnea on
exertion. Initial encounter.

EXAM:
CT CHEST WITHOUT CONTRAST
TECHNIQUE: Multidetector CT imaging of the chest was performed following the
standard protocol without IV contrast.

[Series 4: chest w/o 1mm st · axial · non-contrast · 0.68mm/px · z∈[+1619,+1879]mm · 12 of 357 slices shown, 15 images]
[im 16/357  mediastinal]
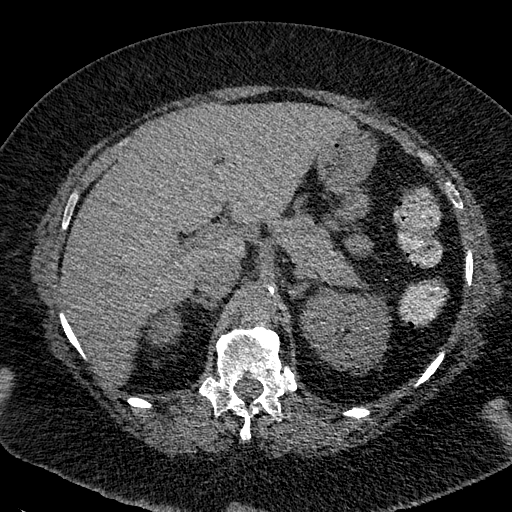
[im 16/357  lung]
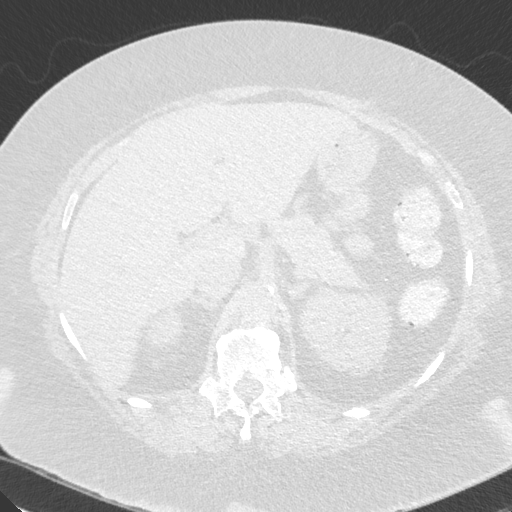
[im 47/357  lung]
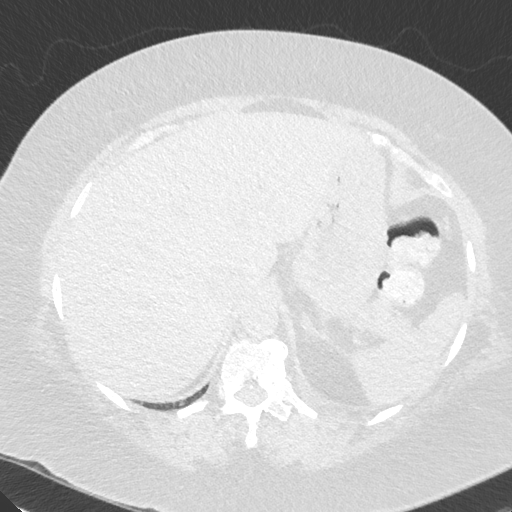
[im 78/357  lung]
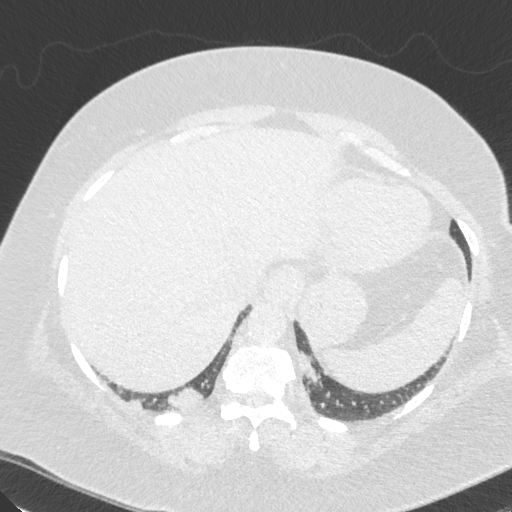
[im 109/357  lung]
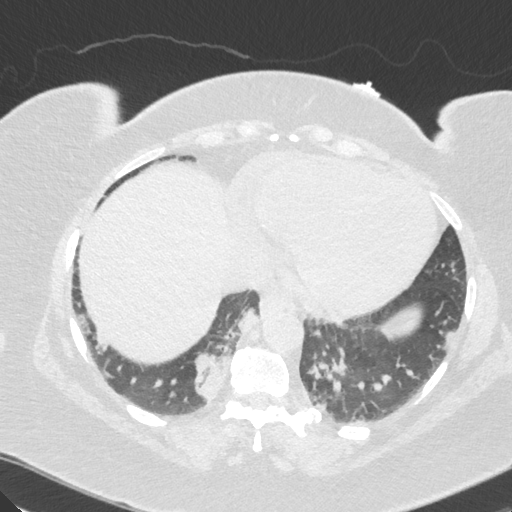
[im 140/357  mediastinal]
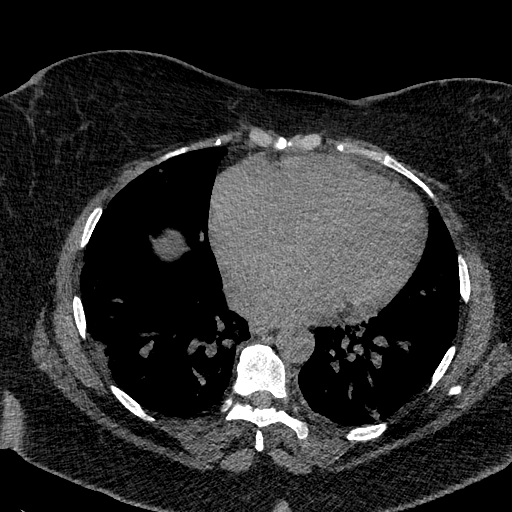
[im 140/357  lung]
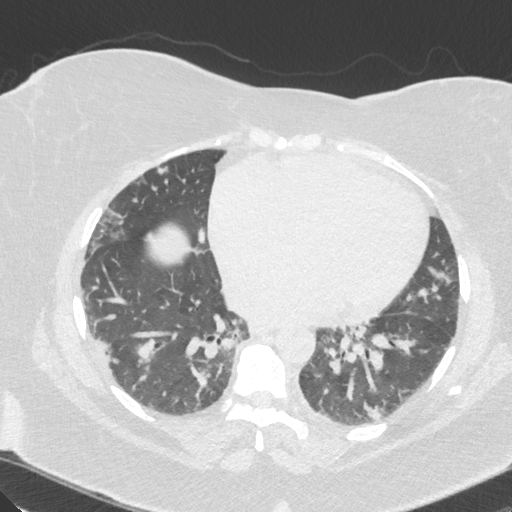
[im 171/357  lung]
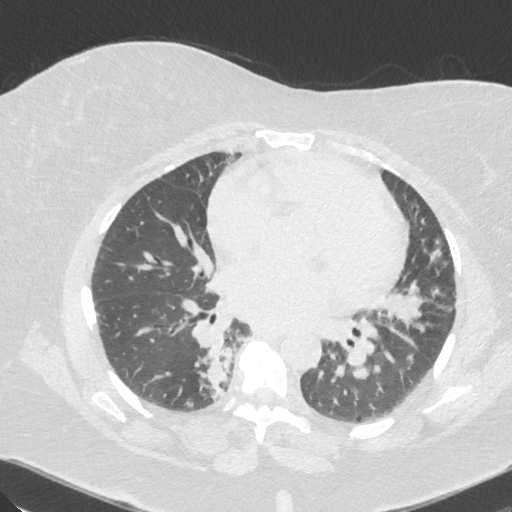
[im 186/357  lung]
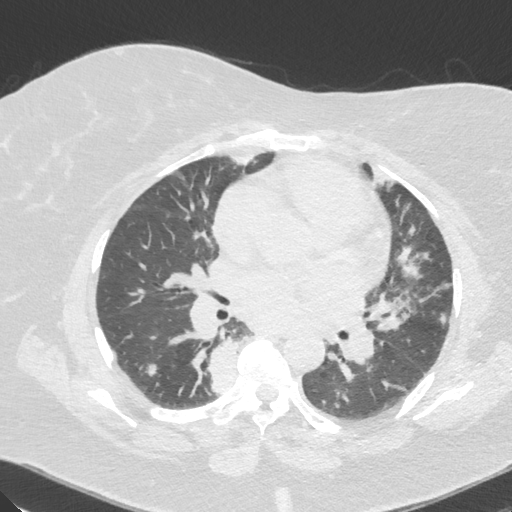
[im 217/357  lung]
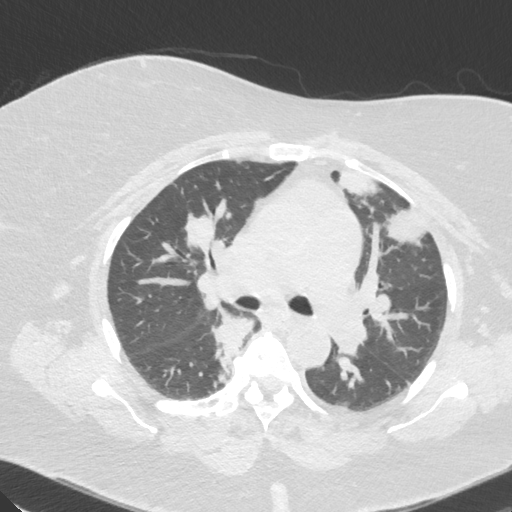
[im 248/357  mediastinal]
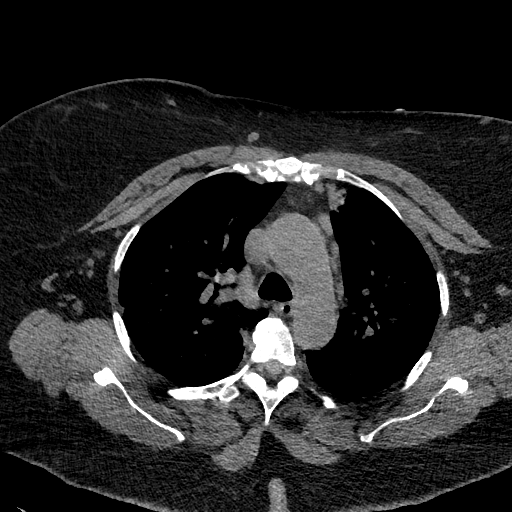
[im 248/357  lung]
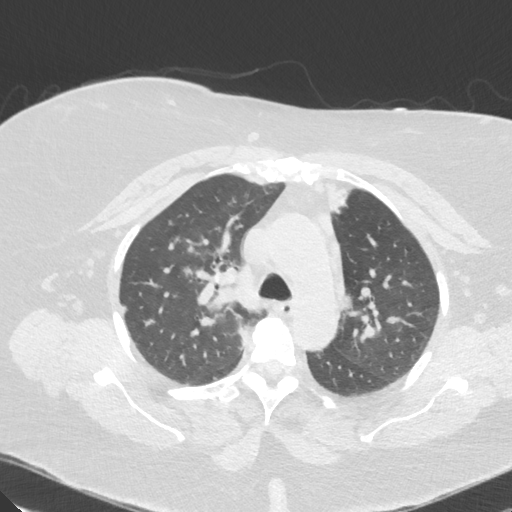
[im 279/357  lung]
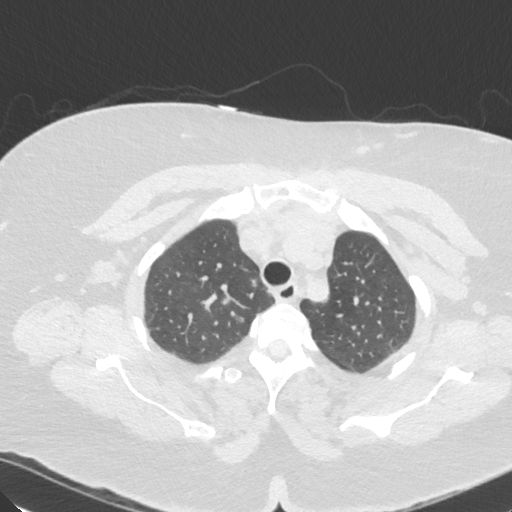
[im 310/357  lung]
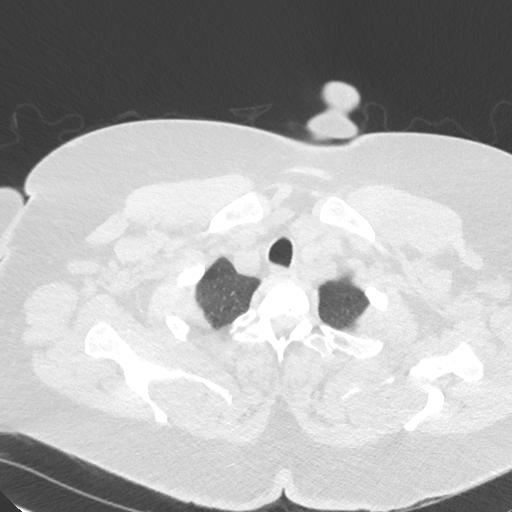
[im 341/357  lung]
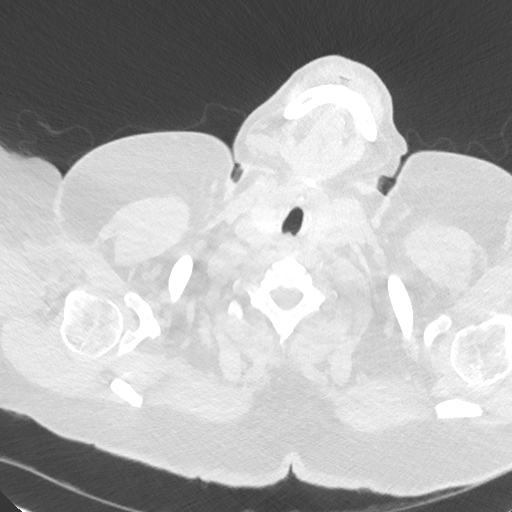

[Series 5: chest w/o 3mm st cor · coronal · non-contrast · 0.62mm/px · 3 of 89 slices shown]
[im 18/89  lung]
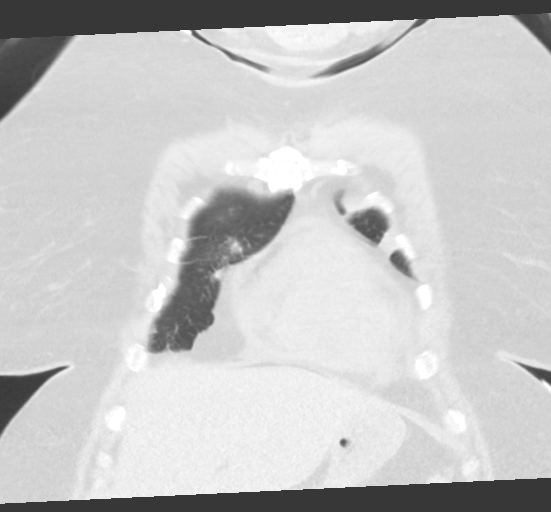
[im 36/89  lung]
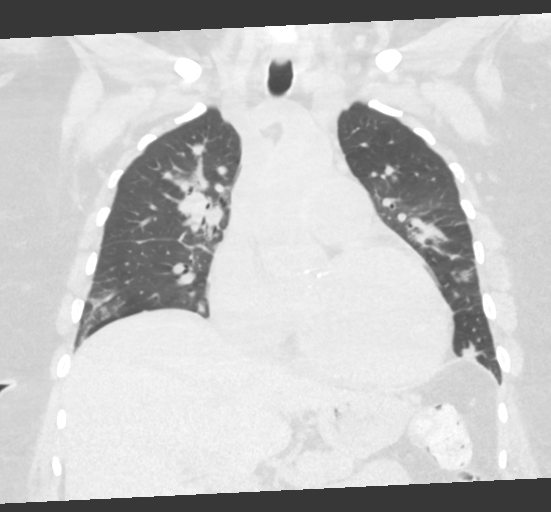
[im 53/89  lung]
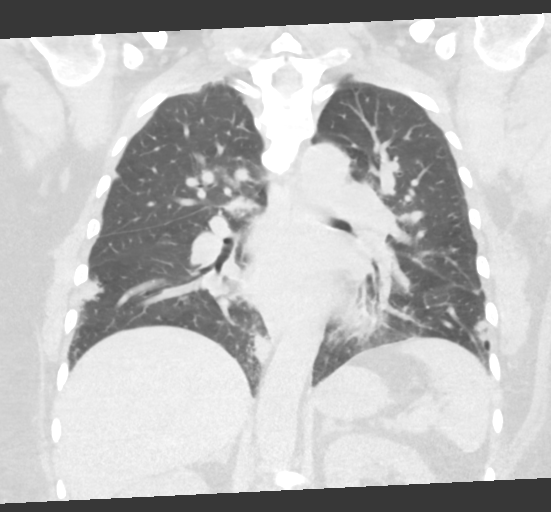

[15 of 36 positions shown; findings below may reference images not displayed]

FINDINGS: Prominent bilateral nodular airspace opacities are seen, of varying
size and scattered throughout both lungs. The distribution and size
of these opacities is essentially unchanged from the prior study in
October 2014. It is unusual for opacities to remain unchanged for 7
months; malignancy is considered unlikely, and atypical infection
relatively unlikely. An inflammatory condition such as sarcoidosis
might have such an appearance, though the relative lack of
lymphadenopathy suggests against sarcoidosis.

No significant pleural effusion or pneumothorax is seen.

The heart is mildly enlarged. Trace pericardial fluid remains within
normal limits. Visualized mediastinal nodes remain normal in size.
The hila are difficult to fully assess due to surrounding opacities.
The great vessels are grossly unremarkable in appearance. The
visualized portions of the thyroid gland are unremarkable. No
axillary lymphadenopathy is seen.

The visualized portions of the liver and spleen are unremarkable.
The visualized portions of the pancreas, adrenal glands and kidneys
are within normal limits. Nonspecific bilateral perinephric
stranding is noted.

No acute osseous abnormalities are identified. Anterior bridging
osteophytes are noted along the mid thoracic spine. A stable focal
1.2 cm lucency is noted along the right side of the manubrium.
IMPRESSION: 1. Prominent bilateral nodular airspace opacities, of varying size
and scattered throughout both lungs, are essentially unchanged from
the prior study in October 2014. It is unusual for opacities to remain
entirely unchanged for 7 months. Malignancy is considered unlikely,
and atypical infection relatively unlikely. An inflammatory
condition such as sarcoidosis might have such an appearance, though
the relative lack of adenopathy suggests against sarcoidosis.
Pulmonary consultation would be helpful.
2. Mild cardiomegaly noted.
3. Stable focal 1.2 cm lucency along the right side of the
manubrium, nonspecific in appearance.

These results were called by telephone at the time of interpretation
on 06/08/2015 at [DATE] to Dr. ABIMELK TIGER, who verbally
acknowledged these results.

## 2017-04-09 IMAGING — CR DG CHEST 2V
2 series · 2 of 2 positions shown · non-contrast
Comparison: PA and lateral chest 11/10/2014 and 11/25/2014. CT
chest 11/08/2014.

CLINICAL DATA: Shortness of breath, fatigue in fluid retention in
both legs in the chest. Initial encounter.

EXAM:
CHEST  2 VIEW

[chest pa]
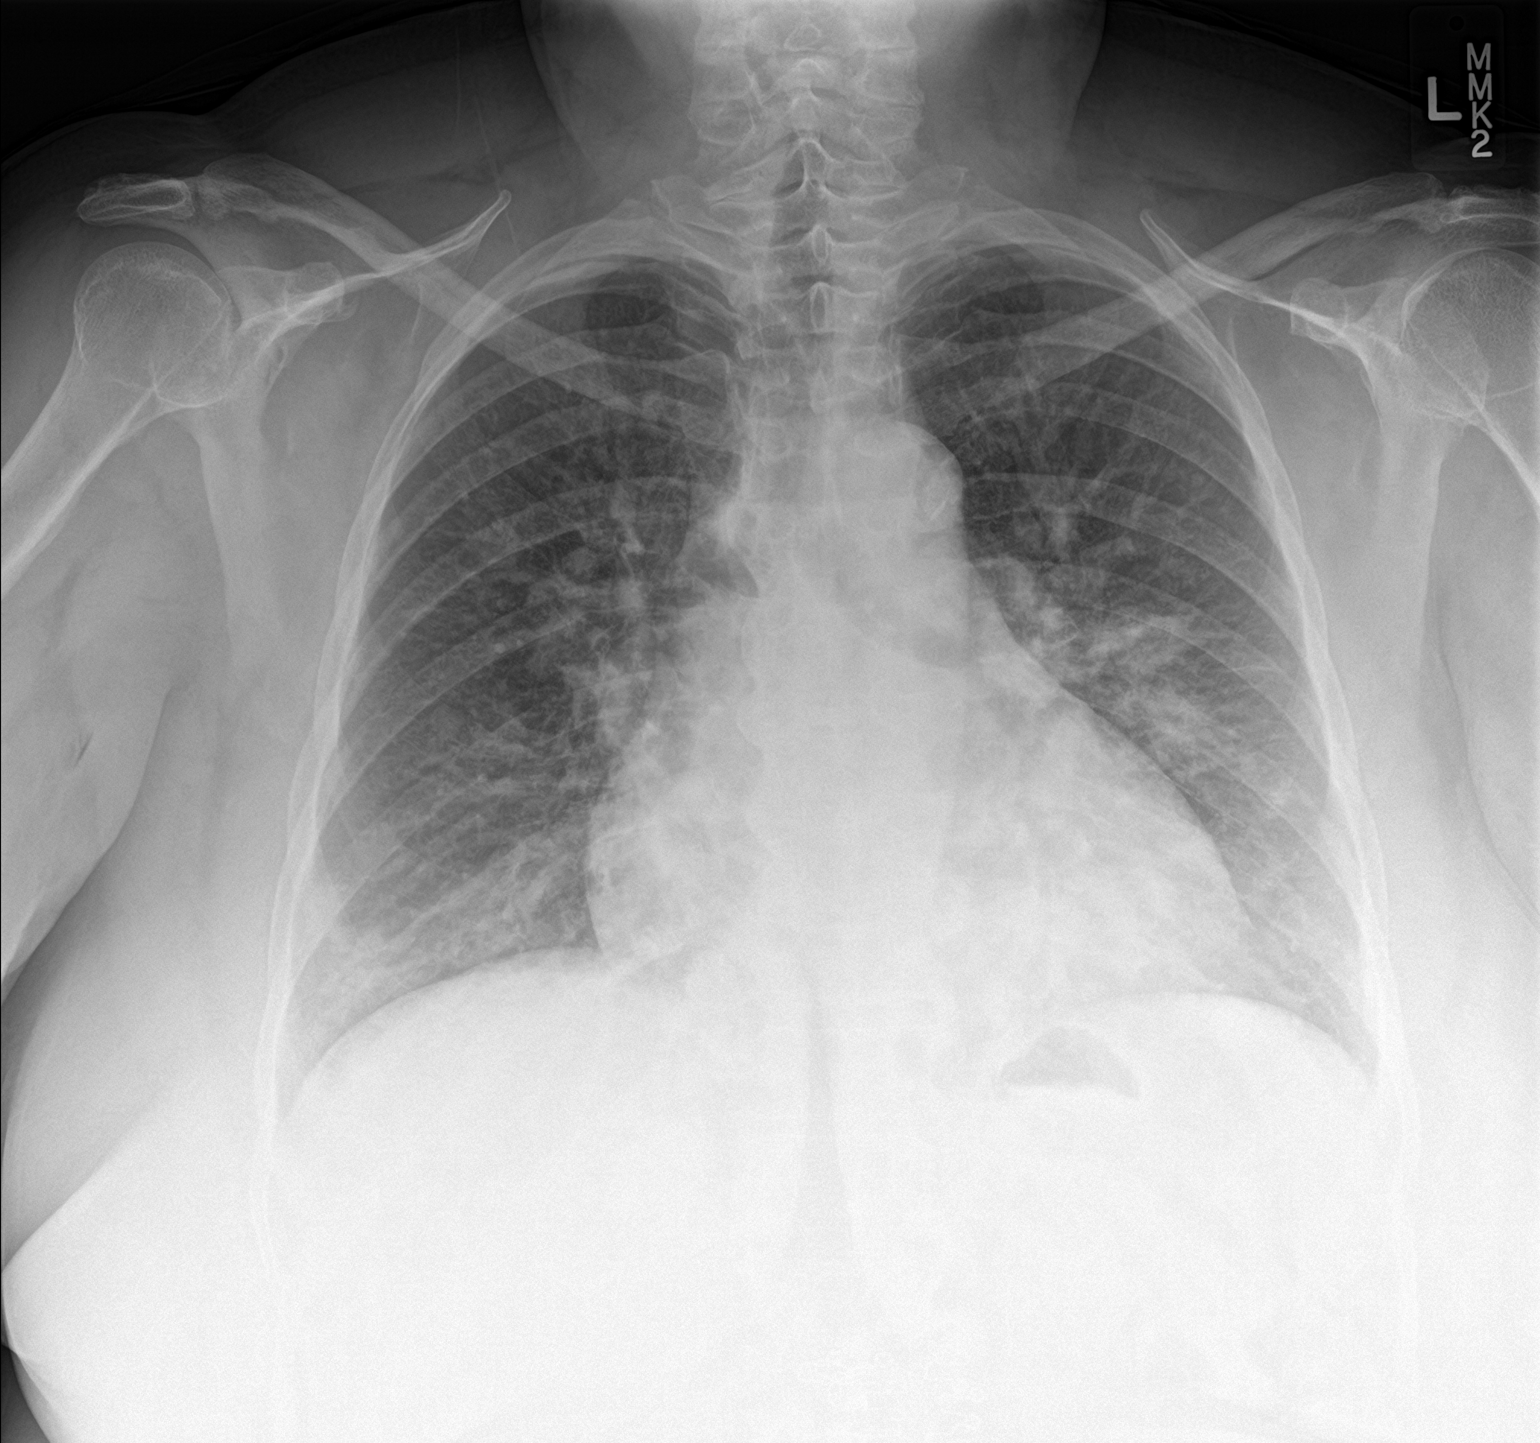

[chest lat]
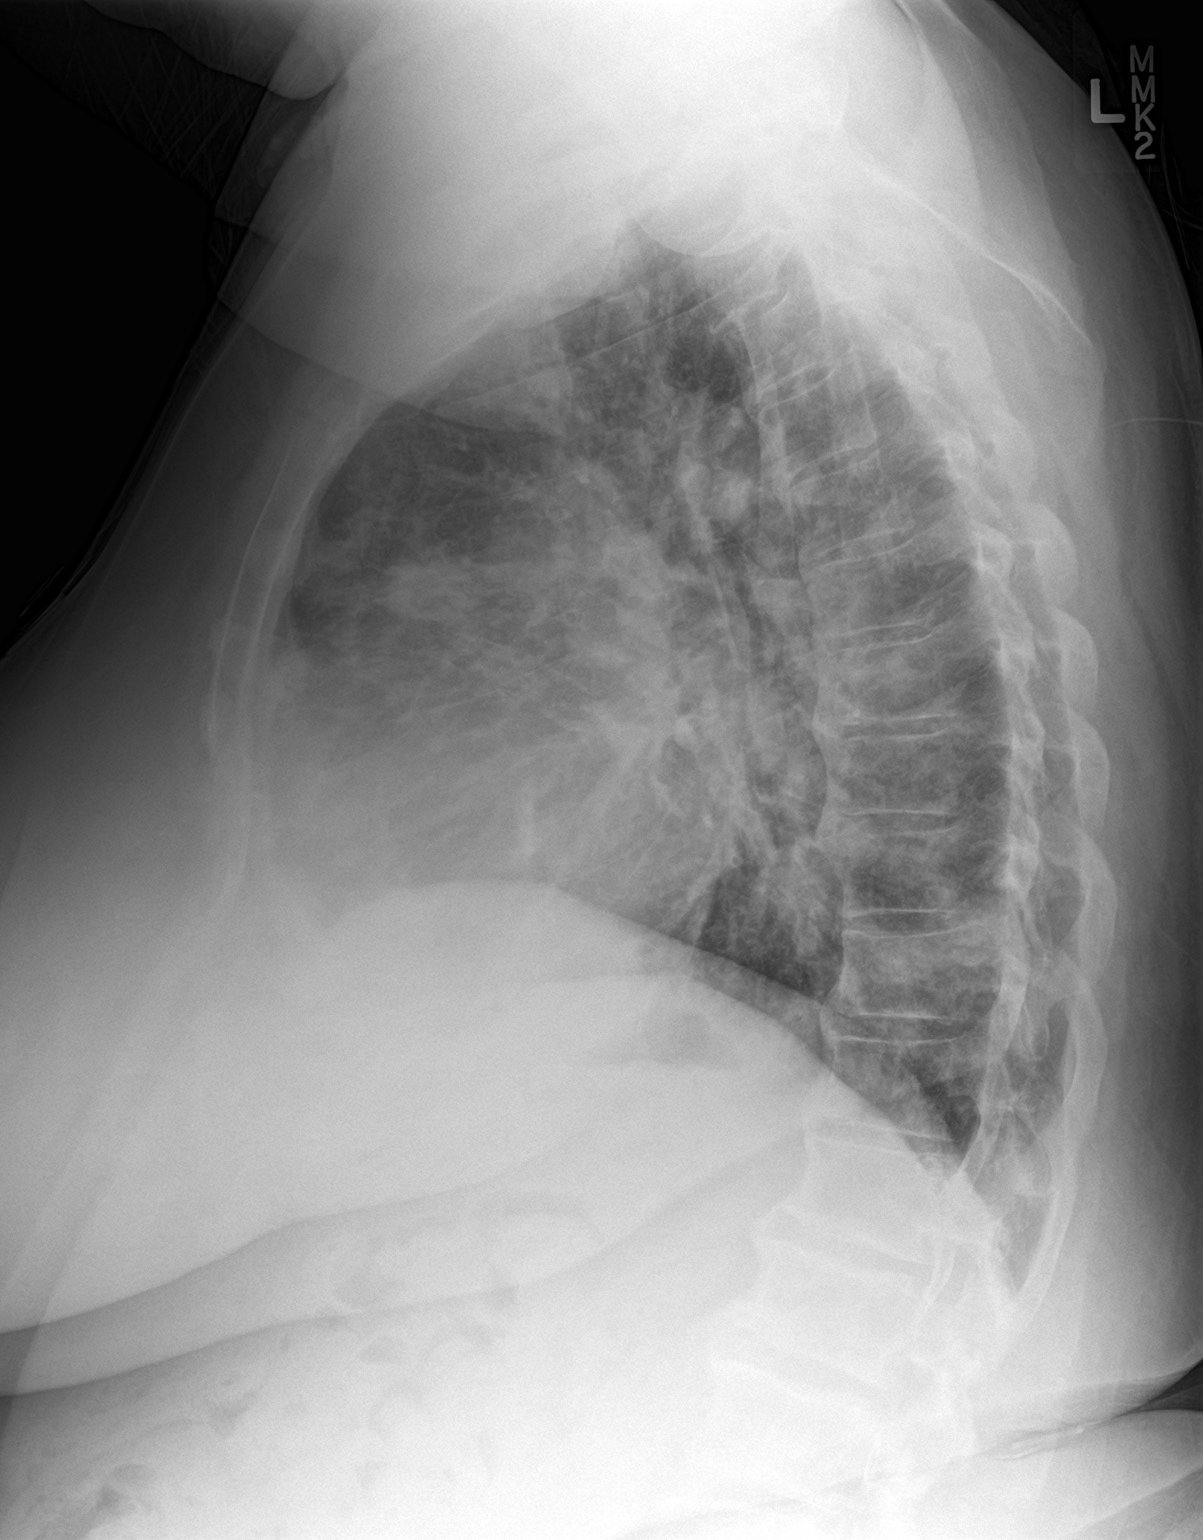

[2 of 2 positions shown; findings below may reference images not displayed]

FINDINGS: There is patchy airspace disease in the lingula and right lower
lobe. The appearance is not grossly changed compared to the most
recent examination. There is cardiomegaly without plain film
evidence of pulmonary edema. No pneumothorax or pleural effusion.
IMPRESSION: Patchy bilateral airspace disease appears unchanged compared to the
most recent study most compatible with pneumonia, possibly atypical.
The appearance is not suggestive of pulmonary edema.

## 2017-04-27 DIAGNOSIS — E785 Hyperlipidemia, unspecified: Secondary | ICD-10-CM | POA: Diagnosis present

## 2017-04-27 DIAGNOSIS — E854 Organ-limited amyloidosis: Secondary | ICD-10-CM | POA: Diagnosis not present

## 2017-04-27 DIAGNOSIS — I129 Hypertensive chronic kidney disease with stage 1 through stage 4 chronic kidney disease, or unspecified chronic kidney disease: Secondary | ICD-10-CM | POA: Diagnosis present

## 2017-04-27 DIAGNOSIS — Z79899 Other long term (current) drug therapy: Secondary | ICD-10-CM | POA: Diagnosis not present

## 2017-04-27 DIAGNOSIS — D890 Polyclonal hypergammaglobulinemia: Secondary | ICD-10-CM | POA: Diagnosis not present

## 2017-04-27 DIAGNOSIS — Z888 Allergy status to other drugs, medicaments and biological substances status: Secondary | ICD-10-CM | POA: Diagnosis not present

## 2017-04-27 DIAGNOSIS — E039 Hypothyroidism, unspecified: Secondary | ICD-10-CM | POA: Diagnosis present

## 2017-04-27 DIAGNOSIS — D509 Iron deficiency anemia, unspecified: Secondary | ICD-10-CM | POA: Diagnosis present

## 2017-04-27 DIAGNOSIS — R918 Other nonspecific abnormal finding of lung field: Secondary | ICD-10-CM | POA: Diagnosis present

## 2017-04-27 DIAGNOSIS — E859 Amyloidosis, unspecified: Secondary | ICD-10-CM | POA: Diagnosis present

## 2017-04-27 DIAGNOSIS — J189 Pneumonia, unspecified organism: Secondary | ICD-10-CM | POA: Diagnosis not present

## 2017-04-27 DIAGNOSIS — Z7982 Long term (current) use of aspirin: Secondary | ICD-10-CM | POA: Diagnosis not present

## 2017-04-27 DIAGNOSIS — R0602 Shortness of breath: Secondary | ICD-10-CM | POA: Diagnosis not present

## 2017-04-27 DIAGNOSIS — J9601 Acute respiratory failure with hypoxia: Secondary | ICD-10-CM | POA: Diagnosis not present

## 2017-04-27 DIAGNOSIS — K8020 Calculus of gallbladder without cholecystitis without obstruction: Secondary | ICD-10-CM | POA: Diagnosis present

## 2017-04-27 DIAGNOSIS — R1031 Right lower quadrant pain: Secondary | ICD-10-CM | POA: Diagnosis not present

## 2017-04-27 DIAGNOSIS — K5909 Other constipation: Secondary | ICD-10-CM | POA: Diagnosis not present

## 2017-04-27 DIAGNOSIS — Z794 Long term (current) use of insulin: Secondary | ICD-10-CM | POA: Diagnosis not present

## 2017-04-27 DIAGNOSIS — D638 Anemia in other chronic diseases classified elsewhere: Secondary | ICD-10-CM | POA: Diagnosis present

## 2017-04-27 DIAGNOSIS — J99 Respiratory disorders in diseases classified elsewhere: Secondary | ICD-10-CM | POA: Diagnosis not present

## 2017-04-27 DIAGNOSIS — R109 Unspecified abdominal pain: Secondary | ICD-10-CM | POA: Diagnosis not present

## 2017-04-27 DIAGNOSIS — E1122 Type 2 diabetes mellitus with diabetic chronic kidney disease: Secondary | ICD-10-CM | POA: Diagnosis present

## 2017-04-27 DIAGNOSIS — D259 Leiomyoma of uterus, unspecified: Secondary | ICD-10-CM | POA: Diagnosis not present

## 2017-04-27 DIAGNOSIS — N184 Chronic kidney disease, stage 4 (severe): Secondary | ICD-10-CM | POA: Diagnosis present

## 2017-04-28 DIAGNOSIS — J9601 Acute respiratory failure with hypoxia: Secondary | ICD-10-CM | POA: Diagnosis not present

## 2017-04-28 DIAGNOSIS — R109 Unspecified abdominal pain: Secondary | ICD-10-CM | POA: Diagnosis not present

## 2017-04-28 DIAGNOSIS — J99 Respiratory disorders in diseases classified elsewhere: Secondary | ICD-10-CM | POA: Diagnosis not present

## 2017-04-28 DIAGNOSIS — N184 Chronic kidney disease, stage 4 (severe): Secondary | ICD-10-CM | POA: Diagnosis not present

## 2017-04-28 DIAGNOSIS — E859 Amyloidosis, unspecified: Secondary | ICD-10-CM | POA: Diagnosis not present

## 2017-04-28 DIAGNOSIS — E785 Hyperlipidemia, unspecified: Secondary | ICD-10-CM | POA: Diagnosis not present

## 2017-04-28 DIAGNOSIS — R918 Other nonspecific abnormal finding of lung field: Secondary | ICD-10-CM | POA: Diagnosis not present

## 2017-04-28 DIAGNOSIS — E039 Hypothyroidism, unspecified: Secondary | ICD-10-CM | POA: Diagnosis not present

## 2017-04-28 DIAGNOSIS — J189 Pneumonia, unspecified organism: Secondary | ICD-10-CM | POA: Diagnosis not present

## 2017-04-28 DIAGNOSIS — D509 Iron deficiency anemia, unspecified: Secondary | ICD-10-CM | POA: Diagnosis not present

## 2017-04-28 DIAGNOSIS — E854 Organ-limited amyloidosis: Secondary | ICD-10-CM | POA: Diagnosis not present

## 2017-05-01 DIAGNOSIS — E119 Type 2 diabetes mellitus without complications: Secondary | ICD-10-CM | POA: Diagnosis not present

## 2017-05-01 DIAGNOSIS — R1011 Right upper quadrant pain: Secondary | ICD-10-CM | POA: Diagnosis not present

## 2017-05-01 DIAGNOSIS — N184 Chronic kidney disease, stage 4 (severe): Secondary | ICD-10-CM | POA: Diagnosis not present

## 2017-05-01 DIAGNOSIS — D509 Iron deficiency anemia, unspecified: Secondary | ICD-10-CM | POA: Diagnosis not present

## 2017-05-01 DIAGNOSIS — I10 Essential (primary) hypertension: Secondary | ICD-10-CM | POA: Diagnosis not present

## 2017-05-01 DIAGNOSIS — E039 Hypothyroidism, unspecified: Secondary | ICD-10-CM | POA: Diagnosis not present

## 2017-05-01 DIAGNOSIS — E7849 Other hyperlipidemia: Secondary | ICD-10-CM | POA: Diagnosis not present

## 2017-05-01 DIAGNOSIS — Z09 Encounter for follow-up examination after completed treatment for conditions other than malignant neoplasm: Secondary | ICD-10-CM | POA: Diagnosis not present

## 2018-05-16 DIAGNOSIS — L988 Other specified disorders of the skin and subcutaneous tissue: Secondary | ICD-10-CM

## 2018-05-16 HISTORY — DX: Other specified disorders of the skin and subcutaneous tissue: L98.8

## 2022-06-24 ENCOUNTER — Encounter (HOSPITAL_COMMUNITY): Payer: Self-pay

## 2022-06-24 ENCOUNTER — Encounter (HOSPITAL_COMMUNITY): Payer: Self-pay | Admitting: Family Medicine

## 2022-06-24 ENCOUNTER — Inpatient Hospital Stay (HOSPITAL_COMMUNITY)
Admission: AD | Admit: 2022-06-24 | Discharge: 2022-06-29 | DRG: 378 | Disposition: A | Discharge status: Home / Self Care | Payer: Medicare Other | Source: Intra-hospital | Attending: Internal Medicine | Admitting: Internal Medicine

## 2022-06-24 DIAGNOSIS — I509 Heart failure, unspecified: Secondary | ICD-10-CM | POA: Diagnosis present

## 2022-06-24 DIAGNOSIS — Z7982 Long term (current) use of aspirin: Secondary | ICD-10-CM | POA: Diagnosis not present

## 2022-06-24 DIAGNOSIS — K2990 Gastroduodenitis, unspecified, without bleeding: Secondary | ICD-10-CM | POA: Diagnosis present

## 2022-06-24 DIAGNOSIS — R918 Other nonspecific abnormal finding of lung field: Secondary | ICD-10-CM | POA: Diagnosis present

## 2022-06-24 DIAGNOSIS — K5730 Diverticulosis of large intestine without perforation or abscess without bleeding: Secondary | ICD-10-CM | POA: Diagnosis present

## 2022-06-24 DIAGNOSIS — Z794 Long term (current) use of insulin: Secondary | ICD-10-CM

## 2022-06-24 DIAGNOSIS — N185 Chronic kidney disease, stage 5: Secondary | ICD-10-CM | POA: Diagnosis present

## 2022-06-24 DIAGNOSIS — Z6839 Body mass index (BMI) 39.0-39.9, adult: Secondary | ICD-10-CM

## 2022-06-24 DIAGNOSIS — N179 Acute kidney failure, unspecified: Secondary | ICD-10-CM | POA: Diagnosis present

## 2022-06-24 DIAGNOSIS — R1013 Epigastric pain: Secondary | ICD-10-CM

## 2022-06-24 DIAGNOSIS — K3189 Other diseases of stomach and duodenum: Secondary | ICD-10-CM | POA: Diagnosis not present

## 2022-06-24 DIAGNOSIS — K922 Gastrointestinal hemorrhage, unspecified: Secondary | ICD-10-CM | POA: Diagnosis not present

## 2022-06-24 DIAGNOSIS — Z888 Allergy status to other drugs, medicaments and biological substances status: Secondary | ICD-10-CM

## 2022-06-24 DIAGNOSIS — E1122 Type 2 diabetes mellitus with diabetic chronic kidney disease: Secondary | ICD-10-CM | POA: Diagnosis present

## 2022-06-24 DIAGNOSIS — Z8249 Family history of ischemic heart disease and other diseases of the circulatory system: Secondary | ICD-10-CM | POA: Diagnosis not present

## 2022-06-24 DIAGNOSIS — K219 Gastro-esophageal reflux disease without esophagitis: Secondary | ICD-10-CM | POA: Diagnosis present

## 2022-06-24 DIAGNOSIS — D62 Acute posthemorrhagic anemia: Secondary | ICD-10-CM | POA: Diagnosis present

## 2022-06-24 DIAGNOSIS — E039 Hypothyroidism, unspecified: Secondary | ICD-10-CM | POA: Diagnosis present

## 2022-06-24 DIAGNOSIS — E1165 Type 2 diabetes mellitus with hyperglycemia: Principal | ICD-10-CM

## 2022-06-24 DIAGNOSIS — K2970 Gastritis, unspecified, without bleeding: Secondary | ICD-10-CM | POA: Diagnosis present

## 2022-06-24 DIAGNOSIS — K625 Hemorrhage of anus and rectum: Secondary | ICD-10-CM | POA: Diagnosis present

## 2022-06-24 DIAGNOSIS — Z79890 Hormone replacement therapy (postmenopausal): Secondary | ICD-10-CM

## 2022-06-24 DIAGNOSIS — I132 Hypertensive heart and chronic kidney disease with heart failure and with stage 5 chronic kidney disease, or end stage renal disease: Secondary | ICD-10-CM | POA: Diagnosis present

## 2022-06-24 DIAGNOSIS — E1129 Type 2 diabetes mellitus with other diabetic kidney complication: Secondary | ICD-10-CM | POA: Diagnosis present

## 2022-06-24 DIAGNOSIS — I160 Hypertensive urgency: Secondary | ICD-10-CM | POA: Diagnosis present

## 2022-06-24 DIAGNOSIS — D649 Anemia, unspecified: Secondary | ICD-10-CM

## 2022-06-24 DIAGNOSIS — D631 Anemia in chronic kidney disease: Secondary | ICD-10-CM | POA: Diagnosis present

## 2022-06-24 DIAGNOSIS — K5521 Angiodysplasia of colon with hemorrhage: Secondary | ICD-10-CM | POA: Diagnosis present

## 2022-06-24 DIAGNOSIS — Z79899 Other long term (current) drug therapy: Secondary | ICD-10-CM | POA: Diagnosis not present

## 2022-06-24 DIAGNOSIS — K222 Esophageal obstruction: Secondary | ICD-10-CM | POA: Diagnosis present

## 2022-06-24 DIAGNOSIS — E785 Hyperlipidemia, unspecified: Secondary | ICD-10-CM | POA: Diagnosis present

## 2022-06-24 DIAGNOSIS — Z800 Family history of malignant neoplasm of digestive organs: Secondary | ICD-10-CM

## 2022-06-24 DIAGNOSIS — E875 Hyperkalemia: Secondary | ICD-10-CM | POA: Diagnosis present

## 2022-06-24 MED ORDER — PANTOPRAZOLE SODIUM 40 MG IV SOLR
40.0000 mg | Freq: Two times a day (BID) | INTRAVENOUS | Status: DC
  Administered 2022-06-25 – 2022-06-27 (×7): 40 mg via INTRAVENOUS
  Filled 2022-06-24 (×7): qty 10

## 2022-06-24 NOTE — Progress Notes (Addendum)
This is a 77 year old female with past medical history of hypertension, diabetes mellitus, CKD stage III, CHF, hypothyroidism, hyperlipidemia, chronic metabolic acidosis, multiple pulmonary nodules.  Today she presented to the [facility] ER with complaints of black stool 2 days ago and bright red blood today.  She is maintained on aspirin daily.  In the ER she is heme positive with melanotic stool.  Her hemoglobin is 5.4.  There is no reports of chest pains.  CT abdomen and pelvis is unrevealing except for multiple lung nodules concerning for metastatic disease. (Of note.  CT scan from 2016 also showed multiple pulmonary nodules). Patient's potassium is 6.1.  Creatinine 6.3.  Most recent creatinine 5.58 on 02/18/2022  Patient is hemodynamically stable with blood pressure 186/72, pulse 86, respiration 18, temperature 98.1 satting 98% on room air.  GI not available at [facility] transfer requested.  2 units packed red blood cell has been ordered.  Patient will receive Kayexalate, insulin, calcium, dextrose prior to transfer. [facility] Dr Randel Pigg and nephrology Dr Mauri [place] consulted via epic chat

## 2022-06-25 ENCOUNTER — Encounter (HOSPITAL_COMMUNITY): Payer: Self-pay | Admitting: Family Medicine

## 2022-06-25 DIAGNOSIS — D62 Acute posthemorrhagic anemia: Secondary | ICD-10-CM

## 2022-06-25 DIAGNOSIS — N179 Acute kidney failure, unspecified: Secondary | ICD-10-CM | POA: Diagnosis not present

## 2022-06-25 DIAGNOSIS — E039 Hypothyroidism, unspecified: Secondary | ICD-10-CM | POA: Diagnosis present

## 2022-06-25 DIAGNOSIS — K922 Gastrointestinal hemorrhage, unspecified: Secondary | ICD-10-CM | POA: Diagnosis not present

## 2022-06-25 DIAGNOSIS — D649 Anemia, unspecified: Secondary | ICD-10-CM

## 2022-06-25 DIAGNOSIS — E875 Hyperkalemia: Secondary | ICD-10-CM | POA: Diagnosis not present

## 2022-06-25 DIAGNOSIS — I160 Hypertensive urgency: Secondary | ICD-10-CM | POA: Diagnosis not present

## 2022-06-25 LAB — CBC
HCT: 21.8 % — ABNORMAL LOW (ref 36.0–46.0)
HCT: 22.6 % — ABNORMAL LOW (ref 36.0–46.0)
HCT: 23.4 % — ABNORMAL LOW (ref 36.0–46.0)
HCT: 23.5 % — ABNORMAL LOW (ref 36.0–46.0)
HCT: 25.5 % — ABNORMAL LOW (ref 36.0–46.0)
Hemoglobin: 6.8 g/dL — CL (ref 12.0–15.0)
Hemoglobin: 7.5 g/dL — ABNORMAL LOW (ref 12.0–15.0)
Hemoglobin: 7.6 g/dL — ABNORMAL LOW (ref 12.0–15.0)
Hemoglobin: 7.8 g/dL — ABNORMAL LOW (ref 12.0–15.0)
Hemoglobin: 8.4 g/dL — ABNORMAL LOW (ref 12.0–15.0)
MCH: 29.4 pg (ref 26.0–34.0)
MCH: 29.9 pg (ref 26.0–34.0)
MCH: 30.5 pg (ref 26.0–34.0)
MCH: 30.7 pg (ref 26.0–34.0)
MCH: 30.9 pg (ref 26.0–34.0)
MCHC: 31.2 g/dL (ref 30.0–36.0)
MCHC: 32.3 g/dL (ref 30.0–36.0)
MCHC: 32.9 g/dL (ref 30.0–36.0)
MCHC: 33.2 g/dL (ref 30.0–36.0)
MCHC: 33.3 g/dL (ref 30.0–36.0)
MCV: 92.1 fL (ref 80.0–100.0)
MCV: 92.5 fL (ref 80.0–100.0)
MCV: 92.7 fL (ref 80.0–100.0)
MCV: 93 fL (ref 80.0–100.0)
MCV: 94.4 fL (ref 80.0–100.0)
Platelets: 206 10*3/uL (ref 150–400)
Platelets: 207 10*3/uL (ref 150–400)
Platelets: 217 10*3/uL (ref 150–400)
Platelets: 220 10*3/uL (ref 150–400)
Platelets: 224 10*3/uL (ref 150–400)
RBC: 2.31 MIL/uL — ABNORMAL LOW (ref 3.87–5.11)
RBC: 2.43 MIL/uL — ABNORMAL LOW (ref 3.87–5.11)
RBC: 2.54 MIL/uL — ABNORMAL LOW (ref 3.87–5.11)
RBC: 2.54 MIL/uL — ABNORMAL LOW (ref 3.87–5.11)
RBC: 2.75 MIL/uL — ABNORMAL LOW (ref 3.87–5.11)
RDW: 16.2 % — ABNORMAL HIGH (ref 11.5–15.5)
RDW: 16.7 % — ABNORMAL HIGH (ref 11.5–15.5)
RDW: 16.8 % — ABNORMAL HIGH (ref 11.5–15.5)
RDW: 16.8 % — ABNORMAL HIGH (ref 11.5–15.5)
RDW: 17 % — ABNORMAL HIGH (ref 11.5–15.5)
WBC: 5.8 10*3/uL (ref 4.0–10.5)
WBC: 6 10*3/uL (ref 4.0–10.5)
WBC: 6.7 10*3/uL (ref 4.0–10.5)
WBC: 6.8 10*3/uL (ref 4.0–10.5)
WBC: 6.9 10*3/uL (ref 4.0–10.5)
nRBC: 0 % (ref 0.0–0.2)
nRBC: 0 % (ref 0.0–0.2)
nRBC: 0 % (ref 0.0–0.2)
nRBC: 0 % (ref 0.0–0.2)
nRBC: 0 % (ref 0.0–0.2)

## 2022-06-25 LAB — COMPREHENSIVE METABOLIC PANEL
ALT: 6 U/L (ref 0–44)
ALT: 7 U/L (ref 0–44)
AST: 9 U/L — ABNORMAL LOW (ref 15–41)
AST: 8 U/L — ABNORMAL LOW (ref 15–41)
Albumin: 2.5 g/dL — ABNORMAL LOW (ref 3.5–5.0)
Albumin: 2.6 g/dL — ABNORMAL LOW (ref 3.5–5.0)
Alkaline Phosphatase: 37 U/L — ABNORMAL LOW (ref 38–126)
Alkaline Phosphatase: 40 U/L (ref 38–126)
Anion gap: 9 (ref 5–15)
Anion gap: 10 (ref 5–15)
BUN: 85 mg/dL — ABNORMAL HIGH (ref 8–23)
BUN: 82 mg/dL — ABNORMAL HIGH (ref 8–23)
CO2: 21 mmol/L — ABNORMAL LOW (ref 22–32)
CO2: 19 mmol/L — ABNORMAL LOW (ref 22–32)
Calcium: 8.3 mg/dL — ABNORMAL LOW (ref 8.9–10.3)
Calcium: 8.3 mg/dL — ABNORMAL LOW (ref 8.9–10.3)
Chloride: 108 mmol/L (ref 98–111)
Chloride: 111 mmol/L (ref 98–111)
Creatinine, Ser: 6.03 mg/dL — ABNORMAL HIGH (ref 0.44–1.00)
Creatinine, Ser: 6.1 mg/dL — ABNORMAL HIGH (ref 0.44–1.00)
GFR, Estimated: 7 mL/min — ABNORMAL LOW (ref >60)
GFR, Estimated: 7 mL/min — ABNORMAL LOW (ref >60)
Glucose, Bld: 83 mg/dL (ref 70–99)
Glucose, Bld: 82 mg/dL (ref 70–99)
Potassium: 4.9 mmol/L (ref 3.5–5.1)
Potassium: 5.3 mmol/L — ABNORMAL HIGH (ref 3.5–5.1)
Sodium: 138 mmol/L (ref 135–145)
Sodium: 140 mmol/L (ref 135–145)
Total Bilirubin: 0.3 mg/dL (ref 0.3–1.2)
Total Bilirubin: 0.5 mg/dL (ref 0.3–1.2)
Total Protein: 6.7 g/dL (ref 6.5–8.1)
Total Protein: 6.8 g/dL (ref 6.5–8.1)

## 2022-06-25 LAB — TYPE AND SCREEN
ABO/RH(D): A NEG
Antibody Screen: NEGATIVE

## 2022-06-25 LAB — BASIC METABOLIC PANEL
Anion gap: 9 (ref 5–15)
BUN: 83 mg/dL — ABNORMAL HIGH (ref 8–23)
CO2: 18 mmol/L — ABNORMAL LOW (ref 22–32)
Calcium: 8.4 mg/dL — ABNORMAL LOW (ref 8.9–10.3)
Chloride: 112 mmol/L — ABNORMAL HIGH (ref 98–111)
Creatinine, Ser: 6.01 mg/dL — ABNORMAL HIGH (ref 0.44–1.00)
GFR, Estimated: 7 mL/min — ABNORMAL LOW (ref >60)
Glucose, Bld: 81 mg/dL (ref 70–99)
Potassium: 5.4 mmol/L — ABNORMAL HIGH (ref 3.5–5.1)
Sodium: 139 mmol/L (ref 135–145)

## 2022-06-25 LAB — PREPARE RBC (CROSSMATCH)

## 2022-06-25 LAB — GLUCOSE, CAPILLARY
Glucose-Capillary: 227 mg/dL — ABNORMAL HIGH (ref 70–99)
Glucose-Capillary: 73 mg/dL (ref 70–99)
Glucose-Capillary: 74 mg/dL (ref 70–99)
Glucose-Capillary: 77 mg/dL (ref 70–99)
Glucose-Capillary: 81 mg/dL (ref 70–99)

## 2022-06-25 LAB — MRSA NEXT GEN BY PCR, NASAL: MRSA by PCR Next Gen: DETECTED — AB

## 2022-06-25 MED ORDER — SODIUM ZIRCONIUM CYCLOSILICATE 10 G PO PACK
10.0000 g | PACK | Freq: Two times a day (BID) | ORAL | Status: DC
  Administered 2022-06-25 – 2022-06-26 (×4): 10 g via ORAL
  Filled 2022-06-25 (×4): qty 1

## 2022-06-25 MED ORDER — PEG-KCL-NACL-NASULF-NA ASC-C 100 G PO SOLR: 1.0000 | Freq: Once | ORAL | Status: DC

## 2022-06-25 MED ORDER — PEG-KCL-NACL-NASULF-NA ASC-C 100 G PO SOLR
0.5000 | Freq: Once | ORAL | Status: AC
  Administered 2022-06-25: 100 g via ORAL

## 2022-06-25 MED ORDER — CARVEDILOL 25 MG PO TABS
25.0000 mg | ORAL_TABLET | Freq: Two times a day (BID) | ORAL | Status: DC
  Administered 2022-06-25 – 2022-06-29 (×9): 25 mg via ORAL
  Filled 2022-06-25 (×9): qty 1

## 2022-06-25 MED ORDER — ONDANSETRON HCL 4 MG PO TABS: 4.0000 mg | ORAL_TABLET | Freq: Four times a day (QID) | ORAL | Status: DC | PRN

## 2022-06-25 MED ORDER — DIPHENHYDRAMINE HCL 25 MG PO CAPS
25.0000 mg | ORAL_CAPSULE | Freq: Once | ORAL | Status: AC
  Administered 2022-06-25: 25 mg via ORAL
  Filled 2022-06-25: qty 1

## 2022-06-25 MED ORDER — SODIUM CHLORIDE 0.9 % IV SOLN: INTRAVENOUS | Status: DC

## 2022-06-25 MED ORDER — ACETAMINOPHEN 650 MG RE SUPP: 650.0000 mg | Freq: Four times a day (QID) | RECTAL | Status: DC | PRN

## 2022-06-25 MED ORDER — PEG-KCL-NACL-NASULF-NA ASC-C 100 G PO SOLR
0.5000 | Freq: Once | ORAL | Status: AC
  Administered 2022-06-25: 100 g via ORAL
  Filled 2022-06-25: qty 1

## 2022-06-25 MED ORDER — MUPIROCIN 2 % EX OINT
1.0000 | TOPICAL_OINTMENT | Freq: Two times a day (BID) | CUTANEOUS | Status: AC
  Administered 2022-06-25 – 2022-06-29 (×10): 1 via NASAL
  Filled 2022-06-25 (×4): qty 22

## 2022-06-25 MED ORDER — HYDRALAZINE HCL 50 MG PO TABS
100.0000 mg | ORAL_TABLET | Freq: Three times a day (TID) | ORAL | Status: DC
  Administered 2022-06-25 – 2022-06-29 (×13): 100 mg via ORAL
  Filled 2022-06-25 (×13): qty 2

## 2022-06-25 MED ORDER — SODIUM CHLORIDE 0.9% IV SOLUTION: Freq: Once | INTRAVENOUS | Status: DC

## 2022-06-25 MED ORDER — LEVOTHYROXINE SODIUM 75 MCG PO TABS
175.0000 ug | ORAL_TABLET | Freq: Every day | ORAL | Status: DC
  Administered 2022-06-25 – 2022-06-29 (×5): 175 ug via ORAL
  Filled 2022-06-25 (×5): qty 1

## 2022-06-25 MED ORDER — ACETAMINOPHEN 325 MG PO TABS
650.0000 mg | ORAL_TABLET | Freq: Once | ORAL | Status: AC
  Administered 2022-06-25: 650 mg via ORAL
  Filled 2022-06-25: qty 2

## 2022-06-25 MED ORDER — SODIUM BICARBONATE 650 MG PO TABS
1300.0000 mg | ORAL_TABLET | Freq: Three times a day (TID) | ORAL | Status: DC
  Administered 2022-06-25 – 2022-06-29 (×12): 1300 mg via ORAL
  Filled 2022-06-25 (×12): qty 2

## 2022-06-25 MED ORDER — LABETALOL HCL 5 MG/ML IV SOLN
10.0000 mg | INTRAVENOUS | Status: DC | PRN
  Administered 2022-06-25 – 2022-06-26 (×3): 10 mg via INTRAVENOUS
  Filled 2022-06-25 (×3): qty 4

## 2022-06-25 MED ORDER — CHLORHEXIDINE GLUCONATE CLOTH 2 % EX PADS
6.0000 | MEDICATED_PAD | Freq: Every day | CUTANEOUS | Status: AC
  Administered 2022-06-25 – 2022-06-29 (×5): 6 via TOPICAL

## 2022-06-25 MED ORDER — SODIUM BICARBONATE 650 MG PO TABS
650.0000 mg | ORAL_TABLET | Freq: Two times a day (BID) | ORAL | Status: DC
  Administered 2022-06-25: 650 mg via ORAL
  Filled 2022-06-25: qty 1

## 2022-06-25 MED ORDER — HYDROXYZINE HCL 25 MG PO TABS
25.0000 mg | ORAL_TABLET | Freq: Four times a day (QID) | ORAL | Status: DC | PRN
  Administered 2022-06-26: 25 mg via ORAL
  Filled 2022-06-25: qty 1

## 2022-06-25 MED ORDER — ONDANSETRON HCL 4 MG/2ML IJ SOLN: 4.0000 mg | Freq: Four times a day (QID) | INTRAMUSCULAR | Status: DC | PRN

## 2022-06-25 MED ORDER — ATORVASTATIN CALCIUM 80 MG PO TABS
80.0000 mg | ORAL_TABLET | Freq: Every day | ORAL | Status: DC
  Administered 2022-06-25 – 2022-06-29 (×5): 80 mg via ORAL
  Filled 2022-06-25 (×5): qty 1

## 2022-06-25 MED ORDER — SODIUM CHLORIDE 0.9% FLUSH
3.0000 mL | Freq: Two times a day (BID) | INTRAVENOUS | Status: DC
  Administered 2022-06-25 – 2022-06-29 (×10): 3 mL via INTRAVENOUS

## 2022-06-25 MED ORDER — ACETAMINOPHEN 325 MG PO TABS
650.0000 mg | ORAL_TABLET | Freq: Four times a day (QID) | ORAL | Status: DC | PRN
  Administered 2022-06-26 – 2022-06-27 (×2): 650 mg via ORAL
  Filled 2022-06-25 (×2): qty 2

## 2022-06-25 MED ORDER — INSULIN ASPART 100 UNIT/ML IJ SOLN
0.0000 [IU] | INTRAMUSCULAR | Status: DC
  Administered 2022-06-25: 2 [IU] via SUBCUTANEOUS
  Administered 2022-06-27 – 2022-06-28 (×3): 1 [IU] via SUBCUTANEOUS
  Administered 2022-06-28: 2 [IU] via SUBCUTANEOUS

## 2022-06-25 NOTE — Progress Notes (Signed)
Date and time results received: 06/25/22 0248  Test: CBC Critical Value: Hgb 6.8  Name of Provider Notified: Quintella Baton, MD  Orders Received: PRBC ordered. Primary RN notified of result and communication with physician.

## 2022-06-25 NOTE — H&P (Signed)
History and Physical    Sheri Pearson [id number] DOB: [birth year] DOA: 06/24/2022  PCP: Arcelia Jew, FNP   Patient coming from: Home   Chief Complaint: Rectal bleeding   HPI: Sheri Pearson is a 77 y.o. female with medical history significant for hypertension, type 2 diabetes mellitus, hypothyroidism, and CKD 5 who presents to the emergency department for evaluation of rectal bleeding.  Patient noted very dark, essentially black stool on 06/22/2022.  She continued to have dark stool the following day, and then an episode of bright red blood per rectum today.  She reports feeling as though she needed to move her bowels, but then just passed mainly red blood today.  She denies any associated abdominal pain, nausea, or vomiting.  She takes a baby aspirin daily and had also been taking omeprazole.  She has been short of breath today without cough, fever, or chest pain.   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  There was an unsuccessful attempt at thrombectomy in late November 2023 and she has been referred to surgery for revision or placement of a new graft in the right arm.  [facility] ED Course: Upon arrival to the ED, patient is found to be afebrile and saturating mid to upper 90s on room air with normal heart rate and elevated blood pressure.  EKG demonstrates sinus rhythm with LVH.  CT of the abdomen pelvis was negative for acute intra-abdominal or pelvic abnormality but notable for multiple bilateral lower lobe pulmonary nodules.  Labs were most notable for hemoglobin 5.6, potassium 6.1, BUN 86, and creatinine 6.35.  Patient was given IV Protonix, 1 L of saline, IV hydralazine, Zofran, Kayexalate, insulin with dextrose, IV calcium, bicarbonate, albuterol, and 2 units of packed red blood cells.  She was transferred to [facility] for  admission.  Review of Systems:  All other systems reviewed and apart from HPI, are negative.  Past Medical History:  Diagnosis Date   Diabetes mellitus without complication ([place])    Fistula 2020   left arm dialysis fistula   Hypertension    Pneumonia     Past Surgical History:  Procedure Laterality Date   VIDEO BRONCHOSCOPY Bilateral 06/11/2015   Procedure: VIDEO BRONCHOSCOPY WITH FLUORO;  Surgeon: Collene Gobble, MD;  Location: [facility];  Service: Cardiopulmonary;  Laterality: Bilateral;    Social History:   reports that she has never smoked. She does not have any smokeless tobacco history on file. She reports that she does not drink alcohol and does not use drugs.  Allergies  Allergen Reactions   Pseudoephedrine Hcl Hives    Family History  Problem Relation Age of Onset   Heart disease Father    Heart disease Mother    Heart disease Brother      Prior to Admission medications   Medication Sig Start Date End Date Taking? Authorizing Provider  acetaminophen (TYLENOL) 650 MG CR tablet Take 650 mg by mouth every 8 (eight) hours as needed for pain.    [provider]  aspirin EC 81 MG tablet Take 81 mg by mouth at bedtime.    [provider]  carbamide peroxide (DEBROX) 6.5 % otic solution Place 1 drop into both ears daily as needed (ear wax).    [provider]  carvedilol (COREG) 25 MG tablet Take 25 mg by mouth 2 (two) times daily  with a meal.    [provider]  Cyanocobalamin (B-12) 2500 MCG TABS Take 2,500 mcg by mouth daily at 12 noon.    [provider]  ferrous sulfate 325 (65 FE) MG EC tablet Take 325 mg by mouth 2 (two) times daily.    [provider]  furosemide (LASIX) 20 MG tablet Take 1 tablet (20 mg total) by mouth daily. 06/14/15   Hongalgi, Lenis Dickinson, MD  hydrALAZINE (APRESOLINE) 25 MG tablet Take 25 mg by mouth 3 (three) times daily. 03/17/15   [provider]  insulin aspart (NOVOLOG) 100  UNIT/ML injection Inject 0-9 Units into the skin 3 (three) times daily with meals. CBG < 70: Eat or drink something and recheck, CBG 70 - 120: 0 units CBG 121 - 150: 1 unit CBG 151 - 200: 2 units CBG 201 - 250: 3 units CBG 251 - 300: 5 units CBG 301 - 350: 7 units CBG 351 - 400: 9 units CBG > 400: call MD. 06/13/15   Modena Jansky, MD  levothyroxine (SYNTHROID, LEVOTHROID) 112 MCG tablet Take 112 mcg by mouth daily before breakfast.    [provider]  meclizine (ANTIVERT) 25 MG tablet Take 25 mg by mouth 3 (three) times daily as needed for dizziness.    [provider]  Omega-3 Fatty Acids (FISH OIL PO) Take 1 capsule by mouth daily at 12 noon.    [provider]  rosuvastatin (CRESTOR) 10 MG tablet Take 10 mg by mouth at bedtime.    [provider]  verapamil (VERELAN PM) 120 MG 24 hr capsule Take 120 mg by mouth 2 (two) times daily.    [provider]    Physical Exam: Vitals:   06/24/22 2331  Weight: 111.2 kg  Height: 5' 6"$  (1.676 m)    Constitutional: NAD, no pallor or diaphoresis   Eyes: PERTLA, lids and conjunctivae normal ENMT: Mucous membranes are moist. Posterior pharynx clear of any exudate or lesions.   Neck: supple, no masses  Respiratory:  no wheezing, no crackles. No accessory muscle use.  Cardiovascular: S1 & S2 heard, regular rate and rhythm. No JVD. Abdomen: No distension, no tenderness, soft. Bowel sounds active.  Musculoskeletal: no clubbing / cyanosis. No joint deformity upper and lower extremities.   Skin: no significant rashes, lesions, ulcers. Warm, dry, well-perfused. Neurologic: CN 2-12 grossly intact. Moving all extremities. Alert and oriented.  Psychiatric: Calm. Cooperative.    Labs and Imaging on Admission: I have personally reviewed following labs and imaging studies  CBC: No results for input(s): "WBC", "NEUTROABS", "HGB", "HCT", "MCV", "PLT" in the last 168 hours. Basic Metabolic Panel: No  results for input(s): "NA", "K", "CL", "CO2", "GLUCOSE", "BUN", "CREATININE", "CALCIUM", "MG", "PHOS" in the last 168 hours. GFR: CrCl cannot be calculated (Patient's most recent lab result is older than the maximum 21 days allowed.). Liver Function Tests: No results for input(s): "AST", "ALT", "ALKPHOS", "BILITOT", "PROT", "ALBUMIN" in the last 168 hours. No results for input(s): "LIPASE", "AMYLASE" in the last 168 hours. No results for input(s): "AMMONIA" in the last 168 hours. Coagulation Profile: No results for input(s): "INR", "PROTIME" in the last 168 hours. Cardiac Enzymes: No results for input(s): "CKTOTAL", "CKMB", "CKMBINDEX", "TROPONINI" in the last 168 hours. BNP (last 3 results) No results for input(s): "PROBNP" in the last 8760 hours. HbA1C: No results for input(s): "HGBA1C" in the last 72 hours. CBG: Recent Labs  Lab 06/25/22 0019  GLUCAP 73   Lipid Profile: No  results for input(s): "CHOL", "HDL", "LDLCALC", "TRIG", "CHOLHDL", "LDLDIRECT" in the last 72 hours. Thyroid Function Tests: No results for input(s): "TSH", "T4TOTAL", "FREET4", "T3FREE", "THYROIDAB" in the last 72 hours. Anemia Panel: No results for input(s): "VITAMINB12", "FOLATE", "FERRITIN", "TIBC", "IRON", "RETICCTPCT" in the last 72 hours. Urine analysis:    Component Value Date/Time   COLORURINE YELLOW 11/10/2014 0355   APPEARANCEUR CLOUDY (A) 11/10/2014 0355   LABSPEC 1.012 11/10/2014 0355   PHURINE 5.0 11/10/2014 0355   GLUCOSEU NEGATIVE 11/10/2014 0355   HGBUR SMALL (A) 11/10/2014 0355   BILIRUBINUR NEGATIVE 11/10/2014 0355   KETONESUR NEGATIVE 11/10/2014 0355   PROTEINUR 100 (A) 11/10/2014 0355   UROBILINOGEN 0.2 11/10/2014 0355   NITRITE NEGATIVE 11/10/2014 0355   LEUKOCYTESUR NEGATIVE 11/10/2014 0355   Sepsis Labs: @LABRCNTIP$ (procalcitonin:4,lacticidven:4) )No results found for this or any previous visit (from the past 240 hour(s)).   Radiological Exams on Admission: No results  found.  EKG: Independently reviewed. Sinus rhythm, LVH.   Assessment/Plan   1. Acute GI bleeding; symptomatic anemia  - Pt reports both melena and BRBPR without abdominal pain or N/V and had initial Hgb 5.6  - She was given IV PPI and transfused 2 units RBC in the ED with resolution of DOE and improvement in Hgb to 7.1  - Continue bowel rest and IV PPI, hold ASA, type & screen, trend H&H, follow-up on GI recommendations    2. Hyperkalemia; AKI superimposed on CKD 5 - Potassium was 6.1 in ED, BUN 86, and SCr 6.35 (5.58 in October)  - AKI likely prerenal in setting of acute blood-loss  - She was given 1 liter NS, bicarb, insulin with dextrose, IV calcium, Kayexalate, and albuterol in ED   - Renally-dose medications, hold Lasix, continue IVF hydration, repeat chem panel in am    3. Hypertensive urgency  - SBP as high as 213 and treated with IV hydralazine in ED  - Continue Coreg and hydralazine, use labetalol if needed    4. IDDM  - A1c was 6.7% in 2021  - Check CBGs and use low-intensity SSI    5. Hypothyroidism  - Continue levothyroxine    6. Pulmonary nodules  - Noted incidentally on CT in ED  - She had bronchoscopy in 2017 with no evidence for malignancy on pathology/cytology  - Discussed with patient, outpatient follow-up recommended    DVT prophylaxis: SCDs  Code Status: Full  Level of Care: Level of care: Progressive Family Communication: None present  Disposition Plan:  Patient is from: home  Anticipated d/c is to: Home  Anticipated d/c date is: 06/27/22  Patient currently: Pending stable H&H, stable renal function and potassium  Consults called: Secure chat with request for routine consult sent to GI and nephrology by accepting physician  Admission status: Inpatient     Vianne Bulls, MD [facility]  06/25/2022, 1:16 AM

## 2022-06-25 NOTE — Consult Note (Addendum)
Attending physician's note   I have taken a history, reviewed the chart, and examined the patient. I performed a substantive portion of this encounter, including complete performance of at least one of the key components, in conjunction with the APP. I agree with the APP's note, impression, and recommendations with my edits.   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  Describes having both dark stools and more recently BRBPR.  Does have intermittent epigastric pain and nausea.  She presented to [facility] ER with hemoglobin 5.6.  Transfused 2 unit PRBCs and transferred to [facility].  Last EGD/colonoscopy was approximately 10 years ago following hospitalization in 02/2011 for anemia (hemoglobin 5).  Since then has been treated for anemia of chronic disease.  1) Hematochezia 2) Acute blood loss anemia superimposed on anemia of chronic disease 3) Symptomatic anemia Based on her clinical description, difficult to tell if this is upper vs lower GI source for bleeding. - Plan for EGD and colonoscopy tomorrow for diagnostic and therapeutic intent - Clears okay today - Bowel prep this evening and n.p.o. at midnight (except medications)  4) Upper abdominal pain 5) Nausea - Evaluate for mucosal/luminal pathology with upper endoscopy as above - Started on high-dose Protonix  6) CKD 5 - Management per primary Hospitalist service - Not yet on HD due to AV graft issues  The indications, risks, and benefits of EGD and colonoscopy were explained to the patient in detail. Risks include but are not limited to bleeding, perforation, adverse reaction to medications, and cardiopulmonary compromise. Sequelae include but are not limited to the possibility of surgery, hospitalization, and mortality. The patient verbalized understanding and wished to proceed. All questions  answered.   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 BUN 86, creatinine 6.35, 5.58 02/2023.  Hypertensive urgency SBP 213, managed by primary.  Insulin-dependent diabetes Managed by primary  Principal Problem:   Acute GI bleeding Active Problems:   Symptomatic anemia   DM (diabetes mellitus), type 2 with renal complications (HCC)   Acute renal failure superimposed on stage 5 chronic kidney disease, not on chronic dialysis (HCC)   Pulmonary nodules/lesions, multiple   Hyperkalemia   Hypertensive urgency   Hypothyroidism    LOS: 1 day     Plan   77 year old female CKD stage 5 with acute on chronic anemia with reports of both dark stool and BRB in stool, last colon/EGD 2013, family history of rectal cancer possible. -Plan for EGD and colonoscopy tomorrow to evaluate further. I thoroughly discussed the procedure to include nature, alternatives, benefits, and risks including but not limited to bleeding, perforation, infection, anesthesia/cardiac and pulmonary complications. Patient provides understanding and gave verbal consent to proceed. -Protonix 40 mg IV BID. --Moviprep, clear liquid diet, NPO at midnight. -  Continue to monitor H&H with transfusion as needed to maintain hemoglobin greater than 7. - Consider VCE if EGD/colon unremarkable  Thank you for your kind consultation, we will continue to follow.         HPI:   Sheri Pearson is a 77  y.o. female with past medical history significant for hypertension, insulin-dependent type 2 diabetes, hypothyroidism, CHF, anemia of chronic disease on p.o. 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  Onset melena 06/22/2022, presented to [facility] ER afebrile, vital stable. CT abdomen pelvis negative for acute abdominal or pelvic abnormality, multiple bilateral lower lobe pulmonary nodules Hemoglobin 5.6 potassium 6.1, BUN 86, creatinine 6.35 Patient started on IV Protonix, Zofran Transfused 2 units PRBC in the ER with resolution of DOE.  But then passed mainly red blood yesterday. Denies abdominal pain, nausea or vomiting. On low-dose aspirin and omeprazole.  Patient has anemia of chronic disease dating back as far as 2013, had evaluation at [facility] for IDA with iron and ferritin being normal at that time.   10 02/2011 hospitalization for anemia Hgb 5, positive FOBT, subsequent EGD and colonoscopy unremarkable other than small polyp in TI biopsies normal.-Unable to see notes. 2013- 2020 baseline hemoglobin 9.9-9.5 2021 hemoglobin was 7.6, 8.4, 02/2022 most revcent Hgb 7.9, MCV 96, normal platelets, no leukocytosis  No family was present at the time of my evaluation. Patient states she has baseline reflux is on medications at home but this has been worse last several weeks.  Has nausea intermittently from in her ear, no episodes of vomiting.  Denies dysphagia, odynophagia. Patient states she is never had GI bleed prior, did have positive stool test a long time ago with EGD colonoscopy. About 3 days ago patient noticed dark loose stools, then began to have dark to bright red stools, was tapered off to just pink.  Last bowel movement was Thursday. Patient denies any associated abdominal pain Patient was having shortness of breath associated with anemia, this resolved  after receiving blood product.  Denies chest pain. She denies NSAIDs, alcohol, blood thinners other than baby aspirin. States father had rectal cancer, had 2 brothers with cancer unknown what type.  Abnormal ED labs: Abnormal Labs Reviewed  MRSA NEXT GEN BY PCR, NASAL - Abnormal; Notable for the following components:      Result Value   MRSA by PCR Next Gen DETECTED (*)    All other components within normal limits  CBC - Abnormal; Notable for the following components:   RBC 2.31 (*)    Hemoglobin 6.8 (*)    HCT 21.8 (*)    RDW 16.2 (*)    All other components within normal limits  COMPREHENSIVE METABOLIC PANEL - Abnormal; Notable for the following components:   CO2 21 (*)    BUN 85 (*)    Creatinine, Ser 6.03 (*)    Calcium 8.3 (*)    Albumin 2.5 (*)    AST 9 (*)    Alkaline Phosphatase 37 (*)    GFR, Estimated 7 (*)    All other components within normal limits  COMPREHENSIVE METABOLIC PANEL - Abnormal; Notable for the following components:   Potassium 5.3 (*)    CO2 19 (*)    BUN 82 (*)    Creatinine, Ser 6.10 (*)    Calcium 8.3 (*)    Albumin 2.6 (*)    AST 8 (*)  GFR, Estimated 7 (*)    All other components within normal limits  CBC - Abnormal; Notable for the following components:   RBC 2.43 (*)    Hemoglobin 7.5 (*)    HCT 22.6 (*)    RDW 16.8 (*)    All other components within normal limits     Past Medical History:  Diagnosis Date   Diabetes mellitus without complication ([place])    Fistula 2020   left arm dialysis fistula   Hypertension    Pneumonia     Surgical History:  She  has a past surgical history that includes Video bronchoscopy (Bilateral, 06/11/2015). Family History:  Her family history includes Heart disease in her brother, father, and mother. Social History:   reports that she has never smoked. She does not have any smokeless tobacco history on file. She reports that she does not drink alcohol and does not use drugs.  Prior to Admission  medications   Medication Sig Start Date End Date Taking? Authorizing Provider  acetaminophen (TYLENOL) 650 MG CR tablet Take 650 mg by mouth every 8 (eight) hours as needed for pain.    [provider]  aspirin EC 81 MG tablet Take 81 mg by mouth at bedtime.    [provider]  carbamide peroxide (DEBROX) 6.5 % otic solution Place 1 drop into both ears daily as needed (ear wax).    [provider]  carvedilol (COREG) 25 MG tablet Take 25 mg by mouth 2 (two) times daily with a meal.    [provider]  Cyanocobalamin (B-12) 2500 MCG TABS Take 2,500 mcg by mouth daily at 12 noon.    [provider]  ferrous sulfate 325 (65 FE) MG EC tablet Take 325 mg by mouth 2 (two) times daily.    [provider]  furosemide (LASIX) 20 MG tablet Take 1 tablet (20 mg total) by mouth daily. 06/14/15   Hongalgi, Lenis Dickinson, MD  hydrALAZINE (APRESOLINE) 25 MG tablet Take 25 mg by mouth 3 (three) times daily. 03/17/15   [provider]  insulin aspart (NOVOLOG) 100 UNIT/ML injection Inject 0-9 Units into the skin 3 (three) times daily with meals. CBG < 70: Eat or drink something and recheck, CBG 70 - 120: 0 units CBG 121 - 150: 1 unit CBG 151 - 200: 2 units CBG 201 - 250: 3 units CBG 251 - 300: 5 units CBG 301 - 350: 7 units CBG 351 - 400: 9 units CBG > 400: call MD. 06/13/15   Modena Jansky, MD  levothyroxine (SYNTHROID, LEVOTHROID) 112 MCG tablet Take 112 mcg by mouth daily before breakfast.    [provider]  meclizine (ANTIVERT) 25 MG tablet Take 25 mg by mouth 3 (three) times daily as needed for dizziness.    [provider]  Omega-3 Fatty Acids (FISH OIL PO) Take 1 capsule by mouth daily at 12 noon.    [provider]  rosuvastatin (CRESTOR) 10 MG tablet Take 10 mg by mouth at bedtime.    [provider]  verapamil (VERELAN PM) 120 MG 24 hr capsule Take 120 mg by mouth 2 (two) times daily.    [provider]    Current Facility-Administered Medications  Medication Dose Route Frequency Provider Last Rate Last Admin   0.9 %  sodium chloride infusion (Manually program via Guardrails IV Fluids)   Intravenous Once Crosley, Debby, MD       0.9 %  sodium chloride infusion  Intravenous Continuous Opyd, Ilene Qua, MD 100 mL/hr at 06/25/22 0052 New Bag at 06/25/22 0052   acetaminophen (TYLENOL) tablet 650 mg  650 mg Oral Q6H PRN Opyd, Ilene Qua, MD       Or   acetaminophen (TYLENOL) suppository 650 mg  650 mg Rectal Q6H PRN Opyd, Ilene Qua, MD       atorvastatin (LIPITOR) tablet 80 mg  80 mg Oral Daily Opyd, Ilene Qua, MD   80 mg at 06/25/22 [id number]   carvedilol (COREG) tablet 25 mg  25 mg Oral BID WC Opyd, Ilene Qua, MD   25 mg at 06/25/22 [id number]   Chlorhexidine Gluconate Cloth 2 % PADS 6 each  6 each Topical Q0600 Vianne Bulls, MD   6 each at 06/25/22 0516   hydrALAZINE (APRESOLINE) tablet 100 mg  100 mg Oral TID Vianne Bulls, MD   100 mg at 06/25/22 [id number]   hydrOXYzine (ATARAX) tablet 25 mg  25 mg Oral Q6H PRN Opyd, Ilene Qua, MD       insulin aspart (novoLOG) injection 0-6 Units  0-6 Units Subcutaneous Q4H Opyd, Ilene Qua, MD       labetalol (NORMODYNE) injection 10 mg  10 mg Intravenous Q4H PRN Opyd, Ilene Qua, MD       levothyroxine (SYNTHROID) tablet 175 mcg  175 mcg Oral Q0600 Vianne Bulls, MD   175 mcg at 06/25/22 [id number]   mupirocin ointment (BACTROBAN) 2 % 1 Application  1 Application Nasal BID Opyd, Ilene Qua, MD   1 Application at [id number]   ondansetron (ZOFRAN) tablet 4 mg  4 mg Oral Q6H PRN Opyd, Ilene Qua, MD       Or   ondansetron (ZOFRAN) injection 4 mg  4 mg Intravenous Q6H PRN Opyd, Ilene Qua, MD       pantoprazole (PROTONIX) injection 40 mg  40 mg Intravenous Q12H Opyd, Ilene Qua, MD   40 mg at 06/25/22 [id number]   sodium bicarbonate tablet 650 mg  650 mg Oral BID Vianne Bulls, MD   650 mg at 06/25/22 [id number]   sodium chloride flush (NS) 0.9 % injection 3 mL  3 mL  Intravenous Q12H Opyd, Ilene Qua, MD   3 mL at 06/25/22 [id number]    Allergies as of 06/24/2022 - Review Complete 06/24/2022  Allergen Reaction Noted   Pseudoephedrine hcl Hives 11/25/2014    Review of Systems:    Constitutional: No weight loss, fever, chills, weakness or fatigue HEENT: Eyes: No change in vision               Ears, Nose, Throat:  No change in hearing or congestion Skin: No rash or itching Cardiovascular: No chest pain, chest pressure or palpitations   Respiratory: No SOB or cough Gastrointestinal: See HPI and otherwise negative Genitourinary: No dysuria or change in urinary frequency Neurological: No headache, dizziness or syncope Musculoskeletal: No new muscle or joint pain Hematologic: No bleeding or bruising Psychiatric: No history of depression or anxiety     Physical Exam:  Vital signs in last 24 hours: Temp:  [97.8 F (36.6 C)-97.9 F (36.6 C)] 97.9 F (36.6 C) (02/10 0455) Pulse Rate:  [70-74] 70 (02/10 0455) Resp:  [14-25] 25 (02/10 0455) BP: (164-174)/(50-86) 174/86 (02/10 0922) SpO2:  [97 %-99 %] 98 % (02/10 0455) Weight:  [111.2 kg] 111.2 kg (02/09 2331) Last BM Date : 06/24/22 Last BM recorded by nurses in past 5 days No data recorded  General:   Pleasant,  obese  female in no acute distress Head:  Normocephalic and atraumatic. Eyes: sclerae anicteric,conjunctive pink  Heart:  regular rate and rhythm Pulm: Clear anteriorly; no wheezing Abdomen:  Soft, Obese AB, Active bowel sounds. mild tenderness in the epigastrium. Without guarding and Without rebound, No organomegaly appreciated. Extremities:  With mild edema. Msk:  Symmetrical without gross deformities. Peripheral pulses intact.  Neurologic:  Alert and  oriented x4;  No focal deficits.  Skin:   Dry and intact without significant lesions or rashes. Psychiatric:  Cooperative. Normal mood and affect.  LAB RESULTS: Recent Labs    06/25/22 0130 06/25/22 0803  WBC 6.8 6.0  HGB 6.8* 7.5*   HCT 21.8* 22.6*  PLT 220 206   BMET Recent Labs    06/25/22 0258 06/25/22 0803  NA 138 140  K 4.9 5.3*  CL 108 111  CO2 21* 19*  GLUCOSE 83 82  BUN 85* 82*  CREATININE 6.03* 6.10*  CALCIUM 8.3* 8.3*   LFT Recent Labs    06/25/22 0803  PROT 6.8  ALBUMIN 2.6*  AST 8*  ALT 7  ALKPHOS 40  BILITOT 0.5   PT/INR No results for input(s): "LABPROT", "INR" in the last 72 hours.  STUDIES: No results found.   Vladimir Crofts  06/25/2022, 9:55 AM

## 2022-06-25 NOTE — H&P (View-Only) (Signed)
Attending physician's note   I have taken a history, reviewed the chart, and examined the patient. I performed a substantive portion of this encounter, including complete performance of at least one of the key components, in conjunction with the APP. I agree with the APP's note, impression, and recommendations with my edits.   77 year old female with medical history as outlined below, to include history of sick 5 (not on HD), HTN, diabetes, transferred from [facility] with GI bleed and symptomatic anemia (DOE).  Describes having both dark stools and more recently BRBPR.  Does have intermittent epigastric pain and nausea.  She presented to [facility] ER with hemoglobin 5.6.  Transfused 2 unit PRBCs and transferred to [facility]  Last EGD/colonoscopy was approximately 10 years ago following hospitalization in 02/2011 for anemia (hemoglobin 5).  Since then has been treated for anemia of chronic disease.  1) Hematochezia 2) Acute blood loss anemia superimposed on anemia of chronic disease 3) Symptomatic anemia Based on her clinical description, difficult to tell if this is upper vs lower GI source for bleeding. - Plan for EGD and colonoscopy tomorrow for diagnostic and therapeutic intent - Clears okay today - Bowel prep this evening and n.p.o. at midnight (except medications)  4) Upper abdominal pain 5) Nausea - Evaluate for mucosal/luminal pathology with upper endoscopy as above - Started on high-dose Protonix  6) CKD 5 - Management per primary Hospitalist service - Not yet on HD due to AV graft issues  The indications, risks, and benefits of EGD and colonoscopy were explained to the patient in detail. Risks include but are not limited to bleeding, perforation, adverse reaction to medications, and cardiopulmonary compromise. Sequelae include but are not limited to the possibility of surgery, hospitalization, and mortality. The patient verbalized understanding and wished to proceed. All questions  answered.   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 BUN 86, creatinine 6.35, 5.58 02/2023.  Hypertensive urgency SBP 213, managed by primary.  Insulin-dependent diabetes Managed by primary  Principal Problem:   Acute GI bleeding Active Problems:   Symptomatic anemia   DM (diabetes mellitus), type 2 with renal complications (HCC)   Acute renal failure superimposed on stage 5 chronic kidney disease, not on chronic dialysis (HCC)   Pulmonary nodules/lesions, multiple   Hyperkalemia   Hypertensive urgency   Hypothyroidism    LOS: 1 day     Plan   77 year old female CKD stage 5 with acute on chronic anemia with reports of both dark stool and BRB in stool, last colon/EGD 2013, family history of rectal cancer possible. -Plan for EGD and colonoscopy tomorrow to evaluate further. I thoroughly discussed the procedure to include nature, alternatives, benefits, and risks including but not limited to bleeding, perforation, infection, anesthesia/cardiac and pulmonary complications. Patient provides understanding and gave verbal consent to proceed. -Protonix 40 mg IV BID. --Moviprep, clear liquid diet, NPO at midnight. -  Continue to monitor H&H with transfusion as needed to maintain hemoglobin greater than 7. - Consider VCE if EGD/colon unremarkable  Thank you for your kind consultation, we will continue to follow.         HPI:   Sheri Pearson is a 77  y.o. female with past medical history significant for hypertension, insulin-dependent type 2 diabetes, hypothyroidism, CHF, anemia of chronic disease on p.o. 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  Onset melena 06/22/2022, presented to [facility] ER afebrile, vital stable. CT abdomen pelvis negative for acute abdominal or pelvic abnormality, multiple bilateral lower lobe pulmonary nodules Hemoglobin 5.6 potassium 6.1, BUN 86, creatinine 6.35 Patient started on IV Protonix, Zofran Transfused 2 units PRBC in the ER with resolution of DOE.  But then passed mainly red blood yesterday. Denies abdominal pain, nausea or vomiting. On low-dose aspirin and omeprazole.  Patient has anemia of chronic disease dating back as far as 2013, had evaluation at [facility] for IDA with iron and ferritin being normal at that time.   10 02/2011 hospitalization for anemia Hgb 5, positive FOBT, subsequent EGD and colonoscopy unremarkable other than small polyp in TI biopsies normal.-Unable to see notes. 2013- 2020 baseline hemoglobin 9.9-9.5 2021 hemoglobin was 7.6, 8.4, 02/2022 most revcent Hgb 7.9, MCV 96, normal platelets, no leukocytosis  No family was present at the time of my evaluation. Patient states she has baseline reflux is on medications at home but this has been worse last several weeks.  Has nausea intermittently from in her ear, no episodes of vomiting.  Denies dysphagia, odynophagia. Patient states she is never had GI bleed prior, did have positive stool test a long time ago with EGD colonoscopy. About 3 days ago patient noticed dark loose stools, then began to have dark to bright red stools, was tapered off to just pink.  Last bowel movement was Thursday. Patient denies any associated abdominal pain Patient was having shortness of breath associated with anemia, this resolved  after receiving blood product.  Denies chest pain. She denies NSAIDs, alcohol, blood thinners other than baby aspirin. States father had rectal cancer, had 2 brothers with cancer unknown what type.  Abnormal ED labs: Abnormal Labs Reviewed  MRSA NEXT GEN BY PCR, NASAL - Abnormal; Notable for the following components:      Result Value   MRSA by PCR Next Gen DETECTED (*)    All other components within normal limits  CBC - Abnormal; Notable for the following components:   RBC 2.31 (*)    Hemoglobin 6.8 (*)    HCT 21.8 (*)    RDW 16.2 (*)    All other components within normal limits  COMPREHENSIVE METABOLIC PANEL - Abnormal; Notable for the following components:   CO2 21 (*)    BUN 85 (*)    Creatinine, Ser 6.03 (*)    Calcium 8.3 (*)    Albumin 2.5 (*)    AST 9 (*)    Alkaline Phosphatase 37 (*)    GFR, Estimated 7 (*)    All other components within normal limits  COMPREHENSIVE METABOLIC PANEL - Abnormal; Notable for the following components:   Potassium 5.3 (*)    CO2 19 (*)    BUN 82 (*)    Creatinine, Ser 6.10 (*)    Calcium 8.3 (*)    Albumin 2.6 (*)    AST 8 (*)  GFR, Estimated 7 (*)    All other components within normal limits  CBC - Abnormal; Notable for the following components:   RBC 2.43 (*)    Hemoglobin 7.5 (*)    HCT 22.6 (*)    RDW 16.8 (*)    All other components within normal limits     Past Medical History:  Diagnosis Date   Diabetes mellitus without complication ([place])    Fistula 2020   left arm dialysis fistula   Hypertension    Pneumonia     Surgical History:  She  has a past surgical history that includes Video bronchoscopy (Bilateral, 06/11/2015). Family History:  Her family history includes Heart disease in her brother, father, and mother. Social History:   reports that she has never smoked. She does not have any smokeless tobacco history on file. She reports that she does not drink alcohol and does not use drugs.  Prior to Admission  medications   Medication Sig Start Date End Date Taking? Authorizing Provider  acetaminophen (TYLENOL) 650 MG CR tablet Take 650 mg by mouth every 8 (eight) hours as needed for pain.    [provider]  aspirin EC 81 MG tablet Take 81 mg by mouth at bedtime.    [provider]  carbamide peroxide (DEBROX) 6.5 % otic solution Place 1 drop into both ears daily as needed (ear wax).    [provider]  carvedilol (COREG) 25 MG tablet Take 25 mg by mouth 2 (two) times daily with a meal.    [provider]  Cyanocobalamin (B-12) 2500 MCG TABS Take 2,500 mcg by mouth daily at 12 noon.    [provider]  ferrous sulfate 325 (65 FE) MG EC tablet Take 325 mg by mouth 2 (two) times daily.    [provider]  furosemide (LASIX) 20 MG tablet Take 1 tablet (20 mg total) by mouth daily. 06/14/15   Hongalgi, Lenis Dickinson, MD  hydrALAZINE (APRESOLINE) 25 MG tablet Take 25 mg by mouth 3 (three) times daily. 03/17/15   [provider]  insulin aspart (NOVOLOG) 100 UNIT/ML injection Inject 0-9 Units into the skin 3 (three) times daily with meals. CBG < 70: Eat or drink something and recheck, CBG 70 - 120: 0 units CBG 121 - 150: 1 unit CBG 151 - 200: 2 units CBG 201 - 250: 3 units CBG 251 - 300: 5 units CBG 301 - 350: 7 units CBG 351 - 400: 9 units CBG > 400: call MD. 06/13/15   Modena Jansky, MD  levothyroxine (SYNTHROID, LEVOTHROID) 112 MCG tablet Take 112 mcg by mouth daily before breakfast.    [provider]  meclizine (ANTIVERT) 25 MG tablet Take 25 mg by mouth 3 (three) times daily as needed for dizziness.    [provider]  Omega-3 Fatty Acids (FISH OIL PO) Take 1 capsule by mouth daily at 12 noon.    [provider]  rosuvastatin (CRESTOR) 10 MG tablet Take 10 mg by mouth at bedtime.    [provider]  verapamil (VERELAN PM) 120 MG 24 hr capsule Take 120 mg by mouth 2 (two) times daily.    [provider]    Current Facility-Administered Medications  Medication Dose Route Frequency Provider Last Rate Last Admin   0.9 %  sodium chloride infusion (Manually program via Guardrails IV Fluids)   Intravenous Once Crosley, Debby, MD       0.9 %  sodium chloride infusion  Intravenous Continuous Opyd, Ilene Qua, MD 100 mL/hr at 06/25/22 0052 New Bag at 06/25/22 0052   acetaminophen (TYLENOL) tablet 650 mg  650 mg Oral Q6H PRN Opyd, Ilene Qua, MD       Or   acetaminophen (TYLENOL) suppository 650 mg  650 mg Rectal Q6H PRN Opyd, Ilene Qua, MD       atorvastatin (LIPITOR) tablet 80 mg  80 mg Oral Daily Opyd, Ilene Qua, MD   80 mg at 06/25/22 [id number]   carvedilol (COREG) tablet 25 mg  25 mg Oral BID WC Opyd, Ilene Qua, MD   25 mg at 06/25/22 [id number]   Chlorhexidine Gluconate Cloth 2 % PADS 6 each  6 each Topical Q0600 Vianne Bulls, MD   6 each at 06/25/22 0516   hydrALAZINE (APRESOLINE) tablet 100 mg  100 mg Oral TID Vianne Bulls, MD   100 mg at 06/25/22 [id number]   hydrOXYzine (ATARAX) tablet 25 mg  25 mg Oral Q6H PRN Opyd, Ilene Qua, MD       insulin aspart (novoLOG) injection 0-6 Units  0-6 Units Subcutaneous Q4H Opyd, Ilene Qua, MD       labetalol (NORMODYNE) injection 10 mg  10 mg Intravenous Q4H PRN Opyd, Ilene Qua, MD       levothyroxine (SYNTHROID) tablet 175 mcg  175 mcg Oral Q0600 Vianne Bulls, MD   175 mcg at 06/25/22 [id number]   mupirocin ointment (BACTROBAN) 2 % 1 Application  1 Application Nasal BID Opyd, Ilene Qua, MD   1 Application at [id number]   ondansetron (ZOFRAN) tablet 4 mg  4 mg Oral Q6H PRN Opyd, Ilene Qua, MD       Or   ondansetron (ZOFRAN) injection 4 mg  4 mg Intravenous Q6H PRN Opyd, Ilene Qua, MD       pantoprazole (PROTONIX) injection 40 mg  40 mg Intravenous Q12H Opyd, Ilene Qua, MD   40 mg at 06/25/22 [id number]   sodium bicarbonate tablet 650 mg  650 mg Oral BID Vianne Bulls, MD   650 mg at 06/25/22 [id number]   sodium chloride flush (NS) 0.9 % injection 3 mL  3 mL  Intravenous Q12H Opyd, Ilene Qua, MD   3 mL at 06/25/22 [id number]    Allergies as of 06/24/2022 - Review Complete 06/24/2022  Allergen Reaction Noted   Pseudoephedrine hcl Hives 11/25/2014    Review of Systems:    Constitutional: No weight loss, fever, chills, weakness or fatigue HEENT: Eyes: No change in vision               Ears, Nose, Throat:  No change in hearing or congestion Skin: No rash or itching Cardiovascular: No chest pain, chest pressure or palpitations   Respiratory: No SOB or cough Gastrointestinal: See HPI and otherwise negative Genitourinary: No dysuria or change in urinary frequency Neurological: No headache, dizziness or syncope Musculoskeletal: No new muscle or joint pain Hematologic: No bleeding or bruising Psychiatric: No history of depression or anxiety     Physical Exam:  Vital signs in last 24 hours: Temp:  [97.8 F (36.6 C)-97.9 F (36.6 C)] 97.9 F (36.6 C) (02/10 0455) Pulse Rate:  [70-74] 70 (02/10 0455) Resp:  [14-25] 25 (02/10 0455) BP: (164-174)/(50-86) 174/86 (02/10 0922) SpO2:  [97 %-99 %] 98 % (02/10 0455) Weight:  [111.2 kg] 111.2 kg (02/09 2331) Last BM Date : 06/24/22 Last BM recorded by nurses in past 5 days No data recorded  General:   Pleasant,  obese  female in no acute distress Head:  Normocephalic and atraumatic. Eyes: sclerae anicteric,conjunctive pink  Heart:  regular rate and rhythm Pulm: Clear anteriorly; no wheezing Abdomen:  Soft, Obese AB, Active bowel sounds. mild tenderness in the epigastrium. Without guarding and Without rebound, No organomegaly appreciated. Extremities:  With mild edema. Msk:  Symmetrical without gross deformities. Peripheral pulses intact.  Neurologic:  Alert and  oriented x4;  No focal deficits.  Skin:   Dry and intact without significant lesions or rashes. Psychiatric:  Cooperative. Normal mood and affect.  LAB RESULTS: Recent Labs    06/25/22 0130 06/25/22 0803  WBC 6.8 6.0  HGB 6.8* 7.5*   HCT 21.8* 22.6*  PLT 220 206   BMET Recent Labs    06/25/22 0258 06/25/22 0803  NA 138 140  K 4.9 5.3*  CL 108 111  CO2 21* 19*  GLUCOSE 83 82  BUN 85* 82*  CREATININE 6.03* 6.10*  CALCIUM 8.3* 8.3*   LFT Recent Labs    06/25/22 0803  PROT 6.8  ALBUMIN 2.6*  AST 8*  ALT 7  ALKPHOS 40  BILITOT 0.5   PT/INR No results for input(s): "LABPROT", "INR" in the last 72 hours.  STUDIES: No results found.   Vladimir Crofts  06/25/2022, 9:55 AM

## 2022-06-25 NOTE — Progress Notes (Signed)
PROGRESS NOTE    Sheri Pearson  [id number] DOB: Oct 10, 1945 DOA: 06/24/2022 PCP: Arcelia Jew, FNP    No chief complaint on file.   Brief Narrative:    Sheri Pearson is a 77 y.o. female with medical history significant for hypertension, type 2 diabetes mellitus, hypothyroidism, and CKD 5 who presents to the emergency department for evaluation of rectal bleeding.   Patient noted very dark, essentially black stool on 06/22/2022.  She continued to have dark stool the following day, and then an episode of bright red blood per rectum today.  She reports feeling as though she needed to move her bowels, but then just passed mainly red blood today.  She denies any associated abdominal pain, nausea, or vomiting.  She takes a baby aspirin daily and had also been taking omeprazole.  She has been short of breath today without cough, fever, or chest pain.    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  There was an unsuccessful attempt at thrombectomy in late November 2023 and she has been referred to surgery for revision or placement of a new graft in the right arm.   [place] ED Course: Upon arrival to the ED, patient is found to be afebrile and saturating mid to upper 90s on room air with normal heart rate and elevated blood pressure.  EKG demonstrates sinus rhythm with LVH.  CT of the abdomen pelvis was negative for acute intra-abdominal or pelvic abnormality but notable for multiple bilateral lower lobe pulmonary nodules.  Labs were most notable for hemoglobin 5.6, potassium 6.1, BUN 86, and creatinine 6.35.   Patient was given IV Protonix, 1 L of saline, IV hydralazine, Zofran, Kayexalate, insulin with dextrose, IV calcium, bicarbonate, albuterol, and 2 units of packed red blood cells.  She was transferred to [facility] for admission.   Assessment &  Plan:   Principal Problem:   Acute GI bleeding Active Problems:   Symptomatic anemia   DM (diabetes mellitus), type 2 with renal complications (HCC)   Acute renal failure superimposed on stage 5 chronic kidney disease, not on chronic dialysis ([place])   Pulmonary nodules/lesions, multiple   Hyperkalemia   Hypertensive urgency   Hypothyroidism   Acute GI bleeding  symptomatic anemia  Acute blood loss anemia - Pt reports both melena and BRBPR without abdominal pain or N/V and had initial Hgb 5.6  -Received 3 units PRBC so far, continue to monitor CBC closely -GI input greatly appreciated, plan for endoscopy and colonoscopy in a.m.Marland Kitchen  CKD stage V   CKD 5 - Renally-dose medications, hold Lasix, continue IVF hydration, repeat chem panel in am   -Bicarb  is low at 19 today, will increase p.o. bicarb  Hyperkalemia -Due to above, potassium was 6.5 at [facility], this has improved with IV insulin, D50, bicarb -Currently on clear liquid diet, once advanced will keep renal diet, -Start on Lokelma 10 mg p.o. twice daily   Hypertensive urgency  - SBP as high as 213 and treated with IV hydralazine in ED  - Continue Coreg and hydralazine, use labetalol if needed     IDDM  - A1c was 6.7% in 2021  - Check CBGs and use low-intensity SSI     Hypothyroidism  - Continue levothyroxine     Pulmonary nodules  - Noted incidentally on CT in ED  - She had  bronchoscopy in 2017 with no evidence for malignancy on pathology/cytology  - Discussed with patient, outpatient follow-up recommended   DVT prophylaxis: SCD Code Status: Full Family Communication: None at bedside Disposition:   Status is: Inpatient    Consultants:  GI   Subjective:  No significant events overnight, she denies any complaints currently  Objective: Vitals:   06/25/22 0800 06/25/22 0922 06/25/22 1115 06/25/22 1117  BP: (!) 174/86 (!) 174/86 (!) 153/51   Pulse: 82  63   Resp: 17  18   Temp:    97.7 F (36.5  C)  TempSrc:    Axillary  SpO2: 99%  98%   Weight:      Height:        Intake/Output Summary (Last 24 hours) at 06/25/2022 1207 Last data filed at 06/25/2022 0900 Gross per 24 hour  Intake 438 ml  Output 500 ml  Net -62 ml   Filed Weights   06/24/22 2331  Weight: 111.2 kg    Examination:  Awake Alert, Oriented X 3, No new F.N deficits, Normal affect Symmetrical Chest wall movement, Good air movement bilaterally, CTAB RRR,No Gallops,Rubs or new Murmurs, No Parasternal Heave +ve B.Sounds, Abd Soft, No tenderness, No rebound - guarding or rigidity. No Cyanosis, Clubbing or edema, No new Rash or bruise      Data Reviewed: I have personally reviewed following labs and imaging studies  CBC: Recent Labs  Lab 06/25/22 0130 06/25/22 0803  WBC 6.8 6.0  HGB 6.8* 7.5*  HCT 21.8* 22.6*  MCV 94.4 93.0  PLT 220 [id number]    Basic Metabolic Panel: Recent Labs  Lab 06/25/22 0258 06/25/22 0803  NA 138 140  K 4.9 5.3*  CL 108 111  CO2 21* 19*  GLUCOSE 83 82  BUN 85* 82*  CREATININE 6.03* 6.10*  CALCIUM 8.3* 8.3*    GFR: Estimated Creatinine Clearance: 10.2 mL/min (A) (by C-G formula based on SCr of 6.1 mg/dL (H)).  Liver Function Tests: Recent Labs  Lab 06/25/22 0258 06/25/22 0803  AST 9* 8*  ALT 6 7  ALKPHOS 37* 40  BILITOT 0.3 0.5  PROT 6.7 6.8  ALBUMIN 2.5* 2.6*    CBG: Recent Labs  Lab 06/25/22 0019 06/25/22 0330 06/25/22 0741 06/25/22 1144  GLUCAP 73 77 81 74     Recent Results (from the past 240 hour(s))  MRSA Next Gen by PCR, Nasal     Status: Abnormal   Collection Time: 06/24/22 11:55 PM   Specimen: Nasal Mucosa; Nasal Swab  Result Value Ref Range Status   MRSA by PCR Next Gen DETECTED (A) NOT DETECTED Final    Comment: RESULT CALLED TO, READ BACK BY AND VERIFIED WITH: S. POWERS RN 06/25/2022 @ 0219 BY AB (NOTE) The GeneXpert MRSA Assay (FDA approved for NASAL specimens only), is one component of a comprehensive MRSA colonization  surveillance program. It is not intended to diagnose MRSA infection nor to guide or monitor treatment for MRSA infections. Test performance is not FDA approved in patients less than 56 years old. Performed at [facility], [address] [place], [place] [address]          Radiology Studies: No results found.      Scheduled Meds:  sodium chloride   Intravenous Once   atorvastatin  80 mg Oral Daily   carvedilol  25 mg Oral BID WC   Chlorhexidine Gluconate Cloth  6 each Topical Q0600   hydrALAZINE  100 mg Oral TID  insulin aspart  0-6 Units Subcutaneous Q4H   levothyroxine  175 mcg Oral Q0600   mupirocin ointment  1 Application Nasal BID   pantoprazole (PROTONIX) IV  40 mg Intravenous Q12H   peg 3350 powder  0.5 kit Oral Once   And   peg 3350 powder  0.5 kit Oral Once   sodium bicarbonate  650 mg Oral BID   sodium chloride flush  3 mL Intravenous Q12H   sodium zirconium cyclosilicate  10 g Oral BID   Continuous Infusions:  sodium chloride 100 mL/hr at 06/25/22 0052     LOS: 1 day      Phillips Climes, MD [facility]   To contact the attending provider between 7A-7P or the covering provider during after hours 7P-7A, please log into the web site [email or web address] and access using universal [place] password for that web site. If you do not have the password, please call the hospital operator.  06/25/2022, 12:07 PM

## 2022-06-26 ENCOUNTER — Inpatient Hospital Stay (HOSPITAL_COMMUNITY): Payer: Medicare Other | Admitting: Certified Registered Nurse Anesthetist

## 2022-06-26 ENCOUNTER — Encounter (HOSPITAL_COMMUNITY)
Admission: AD | Disposition: A | Discharge status: Home / Self Care | Payer: Self-pay | Source: Intra-hospital | Attending: Internal Medicine

## 2022-06-26 ENCOUNTER — Inpatient Hospital Stay (HOSPITAL_COMMUNITY): Payer: Medicaid - Out of State | Admitting: Certified Registered Nurse Anesthetist

## 2022-06-26 ENCOUNTER — Encounter (HOSPITAL_COMMUNITY): Payer: Self-pay | Admitting: Family Medicine

## 2022-06-26 DIAGNOSIS — K3189 Other diseases of stomach and duodenum: Secondary | ICD-10-CM | POA: Diagnosis not present

## 2022-06-26 DIAGNOSIS — K222 Esophageal obstruction: Secondary | ICD-10-CM | POA: Diagnosis not present

## 2022-06-26 DIAGNOSIS — D62 Acute posthemorrhagic anemia: Secondary | ICD-10-CM

## 2022-06-26 DIAGNOSIS — K5730 Diverticulosis of large intestine without perforation or abscess without bleeding: Secondary | ICD-10-CM

## 2022-06-26 DIAGNOSIS — K5521 Angiodysplasia of colon with hemorrhage: Secondary | ICD-10-CM

## 2022-06-26 DIAGNOSIS — R1013 Epigastric pain: Secondary | ICD-10-CM

## 2022-06-26 DIAGNOSIS — E039 Hypothyroidism, unspecified: Secondary | ICD-10-CM

## 2022-06-26 DIAGNOSIS — K2970 Gastritis, unspecified, without bleeding: Secondary | ICD-10-CM

## 2022-06-26 DIAGNOSIS — K922 Gastrointestinal hemorrhage, unspecified: Secondary | ICD-10-CM | POA: Diagnosis not present

## 2022-06-26 HISTORY — PX: COLONOSCOPY WITH PROPOFOL: SHX5780

## 2022-06-26 HISTORY — PX: HOT HEMOSTASIS: SHX5433

## 2022-06-26 HISTORY — PX: HEMOSTASIS CLIP PLACEMENT: SHX6857

## 2022-06-26 HISTORY — PX: BIOPSY: SHX5522

## 2022-06-26 HISTORY — PX: ESOPHAGOGASTRODUODENOSCOPY (EGD) WITH PROPOFOL: SHX5813

## 2022-06-26 LAB — GLUCOSE, CAPILLARY
Glucose-Capillary: 179 mg/dL — ABNORMAL HIGH (ref 70–99)
Glucose-Capillary: 125 mg/dL — ABNORMAL HIGH (ref 70–99)
Glucose-Capillary: 99 mg/dL (ref 70–99)
Glucose-Capillary: 111 mg/dL — ABNORMAL HIGH (ref 70–99)
Glucose-Capillary: 149 mg/dL — ABNORMAL HIGH (ref 70–99)
Glucose-Capillary: 149 mg/dL — ABNORMAL HIGH (ref 70–99)

## 2022-06-26 LAB — COMPREHENSIVE METABOLIC PANEL
ALT: 8 U/L (ref 0–44)
AST: 9 U/L — ABNORMAL LOW (ref 15–41)
Albumin: 2.8 g/dL — ABNORMAL LOW (ref 3.5–5.0)
Alkaline Phosphatase: 43 U/L (ref 38–126)
Anion gap: 12 (ref 5–15)
BUN: 72 mg/dL — ABNORMAL HIGH (ref 8–23)
CO2: 17 mmol/L — ABNORMAL LOW (ref 22–32)
Calcium: 8.5 mg/dL — ABNORMAL LOW (ref 8.9–10.3)
Chloride: 113 mmol/L — ABNORMAL HIGH (ref 98–111)
Creatinine, Ser: 5.87 mg/dL — ABNORMAL HIGH (ref 0.44–1.00)
GFR, Estimated: 7 mL/min — ABNORMAL LOW (ref >60)
Glucose, Bld: 112 mg/dL — ABNORMAL HIGH (ref 70–99)
Potassium: 5 mmol/L (ref 3.5–5.1)
Sodium: 142 mmol/L (ref 135–145)
Total Bilirubin: 0.4 mg/dL (ref 0.3–1.2)
Total Protein: 7.3 g/dL (ref 6.5–8.1)

## 2022-06-26 LAB — CBC
HCT: 23 % — ABNORMAL LOW (ref 36.0–46.0)
Hemoglobin: 7.5 g/dL — ABNORMAL LOW (ref 12.0–15.0)
MCH: 30.6 pg (ref 26.0–34.0)
MCHC: 32.6 g/dL (ref 30.0–36.0)
MCV: 93.9 fL (ref 80.0–100.0)
Platelets: 232 10*3/uL (ref 150–400)
RBC: 2.45 MIL/uL — ABNORMAL LOW (ref 3.87–5.11)
RDW: 16.4 % — ABNORMAL HIGH (ref 11.5–15.5)
WBC: 6.9 10*3/uL (ref 4.0–10.5)
nRBC: 0 % (ref 0.0–0.2)

## 2022-06-26 LAB — HEMOGLOBIN AND HEMATOCRIT, BLOOD
HCT: 23.2 % — ABNORMAL LOW (ref 36.0–46.0)
HCT: 20.7 % — ABNORMAL LOW (ref 36.0–46.0)
Hemoglobin: 7.6 g/dL — ABNORMAL LOW (ref 12.0–15.0)
Hemoglobin: 6.8 g/dL — CL (ref 12.0–15.0)

## 2022-06-26 LAB — TYPE AND SCREEN: Unit division: 0

## 2022-06-26 LAB — PREPARE RBC (CROSSMATCH)

## 2022-06-26 SURGERY — COLONOSCOPY WITH PROPOFOL
Anesthesia: Monitor Anesthesia Care

## 2022-06-26 MED ORDER — LIDOCAINE 2% (20 MG/ML) 5 ML SYRINGE
INTRAMUSCULAR | Status: DC | PRN
  Administered 2022-06-26: 60 mg via INTRAVENOUS

## 2022-06-26 MED ORDER — AMLODIPINE BESYLATE 10 MG PO TABS
10.0000 mg | ORAL_TABLET | Freq: Every day | ORAL | Status: DC
  Administered 2022-06-26 – 2022-06-29 (×4): 10 mg via ORAL
  Filled 2022-06-26 (×4): qty 1

## 2022-06-26 MED ORDER — SODIUM CHLORIDE 0.9 % IV SOLN
INTRAVENOUS | Status: AC | PRN
  Administered 2022-06-26: 500 mL via INTRAVENOUS

## 2022-06-26 MED ORDER — PROPOFOL 500 MG/50ML IV EMUL
INTRAVENOUS | Status: DC | PRN
  Administered 2022-06-26: 125 ug/kg/min via INTRAVENOUS

## 2022-06-26 MED ORDER — SODIUM CHLORIDE 0.9% IV SOLUTION: Freq: Once | INTRAVENOUS | Status: AC

## 2022-06-26 MED ORDER — HYDRALAZINE HCL 20 MG/ML IJ SOLN
10.0000 mg | Freq: Once | INTRAMUSCULAR | Status: AC
  Administered 2022-06-26: 10 mg via INTRAVENOUS
  Filled 2022-06-26: qty 1

## 2022-06-26 MED ORDER — ISOSORBIDE MONONITRATE ER 30 MG PO TB24
30.0000 mg | ORAL_TABLET | Freq: Every day | ORAL | Status: DC
  Administered 2022-06-26 – 2022-06-27 (×2): 30 mg via ORAL
  Filled 2022-06-26 (×2): qty 1

## 2022-06-26 MED ORDER — HYDRALAZINE HCL 20 MG/ML IJ SOLN
10.0000 mg | INTRAMUSCULAR | Status: DC | PRN
  Administered 2022-06-26: 10 mg via INTRAVENOUS
  Filled 2022-06-26: qty 1

## 2022-06-26 SURGICAL SUPPLY — 25 items

## 2022-06-26 NOTE — Anesthesia Postprocedure Evaluation (Signed)
Anesthesia Post Note  Patient: Hawwa Westby  Procedure(s) Performed: COLONOSCOPY WITH PROPOFOL ESOPHAGOGASTRODUODENOSCOPY (EGD) WITH PROPOFOL BIOPSY HOT HEMOSTASIS (ARGON PLASMA COAGULATION/BICAP)     Patient location during evaluation: PACU Anesthesia Type: MAC Level of consciousness: awake and alert Pain management: pain level controlled Vital Signs Assessment: post-procedure vital signs reviewed and stable Respiratory status: spontaneous breathing, nonlabored ventilation, respiratory function stable and patient connected to nasal cannula oxygen Cardiovascular status: stable and blood pressure returned to baseline Postop Assessment: no apparent nausea or vomiting Anesthetic complications: no  No notable events documented.  Last Vitals:  Vitals:   06/26/22 1953 06/26/22 2041  BP: (!) 141/49 (!) 155/55  Pulse: 70 75  Resp: 20 20  Temp: 36.7 C 36.6 C  SpO2: 100% 98%    Last Pain:  Vitals:   06/26/22 2041  TempSrc: Oral  PainSc:                  Tiajuana Amass

## 2022-06-26 NOTE — Progress Notes (Signed)
TORB from Dr. Waldron Labs to give morning BP meds early & give PRN IV hydralazine for elevated BP.

## 2022-06-26 NOTE — Op Note (Signed)
[facility] Patient Name: Sheri Pearson Procedure Date : 06/26/2022 MRN: [id number] Attending MD: Gerrit Heck , MD, [id number] Date of Birth: [birth year] CSN: [id number] Age: 77 Admit Type: Inpatient Procedure:                Upper GI endoscopy w/ biopsy Indications:              Epigastric abdominal pain, Acute post hemorrhagic                            anemia, Hematochezia, Melena, Nausea Providers:                Gerrit Heck, MD, Grace Isaac, RN, William Dalton, Technician Referring MD:             Francene Finders Medicines:                Monitored Anesthesia Care Complications:            No immediate complications. Estimated Blood Loss:     Estimated blood loss was minimal. Procedure:                Pre-Anesthesia Assessment:                           - Prior to the procedure, a History and Physical                            was performed, and patient medications and                            allergies were reviewed. The patient's tolerance of                            previous anesthesia was also reviewed. The risks                            and benefits of the procedure and the sedation                            options and risks were discussed with the patient.                            All questions were answered, and informed consent                            was obtained. Prior Anticoagulants: The patient has                            taken no anticoagulant or antiplatelet agents                            except for aspirin. ASA Grade Assessment: III - A  patient with severe systemic disease. After                            reviewing the risks and benefits, the patient was                            deemed in satisfactory condition to undergo the                            procedure.                           After obtaining informed consent, the endoscope was                             passed under direct vision. Throughout the                            procedure, the patient's blood pressure, pulse, and                            oxygen saturations were monitored continuously. The                            [id number] Olympus endoscope was introduced                            through the mouth, and advanced to the third part                            of duodenum. The upper GI endoscopy was                            accomplished without difficulty. The patient                            tolerated the procedure well. Scope In: Scope Out: Findings:      A non-obstructing Schatzki ring was found in the lower third of the       esophagus.      The examined esophagus was normal.      Mild inflammation characterized by congestion (edema) and erythema was       found in the gastric body. Biopsies were taken with a cold forceps for       histology and Helicobacter pylori testing. Estimated blood loss was       minimal.      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 Biopsies were taken with a cold       forceps for histology. Estimated blood loss was minimal. Impression:               - Non-obstructing Schatzki ring.                           - Normal esophagus.                           -  Mild, non-ulcer gastritis. Biopsied.                           - Mucosal changes in the duodenum. Biopsied. Recommendation:           - Await pathology results.                           - Perform a colonoscopy today.                           - Please see colonoscopy report for recommendations                            for ongoing inpatient care. Procedure Code(s):        --- Professional ---                           [phone], Esophagogastroduodenoscopy, flexible,                            transoral; with biopsy, single or multiple Diagnosis Code(s):        --- Professional ---                            K22.2, Esophageal obstruction                           K29.70, Gastritis, unspecified, without bleeding                           K31.89, Other diseases of stomach and duodenum                           R10.13, Epigastric pain                           D62, Acute posthemorrhagic anemia                           K92.1, Melena (includes Hematochezia)                           R11.0, Nausea CPT copyright 2022 American Medical Association. All rights reserved. The codes documented in this report are preliminary and upon coder review may  be revised to meet current compliance requirements. Gerrit Heck, MD 06/26/2022 11:04:10 AM Number of Addenda: 0

## 2022-06-26 NOTE — Anesthesia Preprocedure Evaluation (Signed)
Anesthesia Evaluation  Patient identified by MRN, date of birth, ID band Patient awake    Reviewed: Allergy & Precautions, NPO status , Patient's Chart, lab work & pertinent test results  Airway Mallampati: II  TM Distance: >3 FB Neck ROM: Full    Dental   Pulmonary neg pulmonary ROS   breath sounds clear to auscultation       Cardiovascular hypertension, Pt. on medications and Pt. on home beta blockers  Rhythm:Regular Rate:Normal     Neuro/Psych negative neurological ROS     GI/Hepatic Neg liver ROS,,,GI bleed    Endo/Other  diabetes, Insulin DependentHypothyroidism    Renal/GU CRFRenal disease     Musculoskeletal   Abdominal   Peds  Hematology  (+) Blood dyscrasia, anemia   Anesthesia Other Findings   Reproductive/Obstetrics                             Anesthesia Physical Anesthesia Plan  ASA: 4  Anesthesia Plan: MAC   Post-op Pain Management:    Induction:   PONV Risk Score and Plan: 2 and Propofol infusion  Airway Management Planned: Natural Airway and Nasal Cannula  Additional Equipment:   Intra-op Plan:   Post-operative Plan:   Informed Consent: I have reviewed the patients History and Physical, chart, labs and discussed the procedure including the risks, benefits and alternatives for the proposed anesthesia with the patient or authorized representative who has indicated his/her understanding and acceptance.       Plan Discussed with:   Anesthesia Plan Comments:        Anesthesia Quick Evaluation

## 2022-06-26 NOTE — Progress Notes (Addendum)
PROGRESS NOTE    Sheri Pearson  [id number] DOB: 07/10/1945 DOA: 06/24/2022 PCP: Arcelia Jew, FNP    No chief complaint on file.   Brief Narrative:    Sheri Pearson is a 77 y.o. female with medical history significant for hypertension, type 2 diabetes mellitus, hypothyroidism, and CKD 5 who presents to the emergency department for evaluation of rectal bleeding.   Patient noted very dark, essentially black stool on 06/22/2022.  She continued to have dark stool the following day, and then an episode of bright red blood per rectum today.  She reports feeling as though she needed to move her bowels, but then just passed mainly red blood today.  She denies any associated abdominal pain, nausea, or vomiting.  She takes a baby aspirin daily and had also been taking omeprazole.  She has been short of breath today without cough, fever, or chest pain.    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  There was an unsuccessful attempt at thrombectomy in late November 2023 and she has been referred to surgery for revision or placement of a new graft in the right arm.   [place] ED Course: Upon arrival to the ED, patient is found to be afebrile and saturating mid to upper 90s on room air with normal heart rate and elevated blood pressure.  EKG demonstrates sinus rhythm with LVH.  CT of the abdomen pelvis was negative for acute intra-abdominal or pelvic abnormality but notable for multiple bilateral lower lobe pulmonary nodules.  Labs were most notable for hemoglobin 5.6, potassium 6.1, BUN 86, and creatinine 6.35.   Patient was given IV Protonix, 1 L of saline, IV hydralazine, Zofran, Kayexalate, insulin with dextrose, IV calcium, bicarbonate, albuterol, and 2 units of packed red blood cells.  She was transferred to [facility] for admission.   Assessment &  Plan:   Principal Problem:   Acute GI bleeding Active Problems:   Symptomatic anemia   DM (diabetes mellitus), type 2 with renal complications (HCC)   Acute renal failure superimposed on stage 5 chronic kidney disease, not on chronic dialysis ([place])   Pulmonary nodules/lesions, multiple   Hyperkalemia   Hypertensive urgency   Hypothyroidism   ABLA (acute blood loss anemia)   Acute on chronic anemia   AVM (arteriovenous malformation) of colon with hemorrhage   Diverticulosis of colon without hemorrhage   Abdominal pain, epigastric   Gastritis and gastroduodenitis   Acute lower GI bleeding secondary to angiectasia  symptomatic anemia  Acute blood loss anemia - Pt reports both melena and BRBPR without abdominal pain or N/V and had initial Hgb 5.6  -Received 3 units PRBC so far, continue to monitor CBC closely and transfuse as needed -Status post EGD this morning significant for gastritis, and colonoscopy significant for angiectasia status post APC and clip   CKD 5 - Renally-dose medications, hold Lasix, continue IVF hydration, repeat chem panel in am   -Bicarb  is low at 19 today, will increase p.o. bicarb  Hyperkalemia -Due to above, potassium was 6.5 at [facility], this has improved with IV insulin, D50, bicarb -Currently on clear liquid diet, once advanced will keep renal diet, -Start on Lokelma 10 mg p.o. twice daily   Hypertensive urgency  - SBP as high as 213 and treated with IV hydralazine in ED  - Continue Coreg and hydralazine, blood  pressure remains significantly uncontrolled, added as needed IV hydralazine and amlodipine.   IDDM  - A1c was 6.7% in 2021  - Check CBGs and use low-intensity SSI     Hypothyroidism  - Continue levothyroxine     Pulmonary nodules  - Noted incidentally on CT in ED  - She had bronchoscopy in 2017 with no evidence for malignancy on pathology/cytology  - Discussed with patient, outpatient follow-up recommended   DVT prophylaxis:  SCD Code Status: Full Family Communication: None at bedside, discussed with son by phone Disposition:   Status is: Inpatient    Consultants:  GI   Subjective:  No significant events overnight, she denies any complaints currently, asking if she can be started on a diet after her EGD/colonoscopy this morning  Objective: Vitals:   06/26/22 1100 06/26/22 1110 06/26/22 1120 06/26/22 1253  BP: (!) 127/48 (!) 158/50 (!) 178/55 (!) 180/66  Pulse: 70 68 64 65  Resp: (!) 21 16 16 18  $ Temp:    98.1 F (36.7 C)  TempSrc:    Oral  SpO2: 100%  98% 98%  Weight:      Height:        Intake/Output Summary (Last 24 hours) at 06/26/2022 1418 Last data filed at 06/26/2022 1051 Gross per 24 hour  Intake 203 ml  Output 700 ml  Net -497 ml   Filed Weights   06/24/22 2331  Weight: 111.2 kg    Examination:  Awake Alert, Oriented X 3, No new F.N deficits, Normal affect Symmetrical Chest wall movement, Good air movement bilaterally, CTAB RRR,No Gallops,Rubs or new Murmurs, No Parasternal Heave +ve B.Sounds, Abd Soft, No tenderness, No rebound - guarding or rigidity. No Cyanosis, Clubbing or edema, No new Rash or bruise      Data Reviewed: I have personally reviewed following labs and imaging studies  CBC: Recent Labs  Lab 06/25/22 0803 06/25/22 1211 06/25/22 1725 06/25/22 2330 06/26/22 0606 06/26/22 1157  WBC 6.0 5.8 6.9 6.7 6.9  --   HGB 7.5* 7.6* 8.4* 7.8* 7.5* 7.6*  HCT 22.6* 23.5* 25.5* 23.4* 23.0* 23.2*  MCV 93.0 92.5 92.7 92.1 93.9  --   PLT 206 207 224 217 232  --     Basic Metabolic Panel: Recent Labs  Lab 06/25/22 0258 06/25/22 0803 06/26/22 0606  NA 138 139  140 142  K 4.9 5.4*  5.3* 5.0  CL 108 112*  111 113*  CO2 21* 18*  19* 17*  GLUCOSE 83 81  82 112*  BUN 85* 83*  82* 72*  CREATININE 6.03* 6.01*  6.10* 5.87*  CALCIUM 8.3* 8.4*  8.3* 8.5*    GFR: Estimated Creatinine Clearance: 10.6 mL/min (A) (by C-G formula based on SCr of 5.87 mg/dL  (H)).  Liver Function Tests: Recent Labs  Lab 06/25/22 0258 06/25/22 0803 06/26/22 0606  AST 9* 8* 9*  ALT 6 7 8  $ ALKPHOS 37* 40 43  BILITOT 0.3 0.5 0.4  PROT 6.7 6.8 7.3  ALBUMIN 2.5* 2.6* 2.8*    CBG: Recent Labs  Lab 06/25/22 1555 06/26/22 0103 06/26/22 0355 06/26/22 0751 06/26/22 1202  GLUCAP 227* 179* 125* 99 111*     Recent Results (from the past 240 hour(s))  MRSA Next Gen by PCR, Nasal     Status: Abnormal   Collection Time: 06/24/22 11:55 PM   Specimen: Nasal Mucosa; Nasal Swab  Result Value Ref Range Status   MRSA by PCR Next Gen DETECTED (A) NOT DETECTED Final  Comment: RESULT CALLED TO, READ BACK BY AND VERIFIED WITH: S. POWERS RN 06/25/2022 @ [id number] BY AB (NOTE) The GeneXpert MRSA Assay (FDA approved for NASAL specimens only), is one component of a comprehensive MRSA colonization surveillance program. It is not intended to diagnose MRSA infection nor to guide or monitor treatment for MRSA infections. Test performance is not FDA approved in patients less than 78 years old. Performed at [facility], [place] [address], [place], [place] [address]          Radiology Studies: No results found.      Scheduled Meds:  sodium chloride   Intravenous Once   amLODipine  10 mg Oral Daily   atorvastatin  80 mg Oral Daily   carvedilol  25 mg Oral BID WC   Chlorhexidine Gluconate Cloth  6 each Topical Q0600   hydrALAZINE  100 mg Oral TID   insulin aspart  0-6 Units Subcutaneous Q4H   levothyroxine  175 mcg Oral Q0600   mupirocin ointment  1 Application Nasal BID   pantoprazole (PROTONIX) IV  40 mg Intravenous Q12H   sodium bicarbonate  1,300 mg Oral TID   sodium chloride flush  3 mL Intravenous Q12H   sodium zirconium cyclosilicate  10 g Oral BID   Continuous Infusions:     LOS: 2 days      Phillips Climes, MD [facility]   To contact the attending provider between 7A-7P or the covering provider during after hours 7P-7A,  please log into the web site [email or web address] and access using universal [facility] password for that web site. If you do not have the password, please call the hospital operator.  06/26/2022, 2:18 PM

## 2022-06-26 NOTE — Op Note (Signed)
[facility] Patient Name: Sheri Pearson Procedure Date : 06/26/2022 MRN: [id number] Attending MD: Gerrit Heck , MD, [id number] Date of Birth: 11-08-1945 CSN: [id number] Age: 77 Admit Type: Inpatient Procedure:                Colonoscopy with treatment of bleeding Indications:              Hematochezia, Melena, Acute post hemorrhagic anemia                            superimposed on anemia of chronic disease,                            Symptomatic anemia Providers:                Gerrit Heck, MD, Grace Isaac, RN, William Dalton, Technician Referring MD:             Francene Finders Medicines:                Monitored Anesthesia Care Complications:            No immediate complications. Estimated Blood Loss:     None after treatment of the active bleeding source. Procedure:                Pre-Anesthesia Assessment:                           - Prior to the procedure, a History and Physical                            was performed, and patient medications and                            allergies were reviewed. The patient's tolerance of                            previous anesthesia was also reviewed. The risks                            and benefits of the procedure and the sedation                            options and risks were discussed with the patient.                            All questions were answered, and informed consent                            was obtained. Prior Anticoagulants: The patient has                            taken no anticoagulant or antiplatelet agents  except for aspirin. ASA Grade Assessment: III - A                            patient with severe systemic disease. After                            reviewing the risks and benefits, the patient was                            deemed in satisfactory condition to undergo the                            procedure.                            After obtaining informed consent, the colonoscope                            was passed under direct vision. Throughout the                            procedure, the patient's blood pressure, pulse, and                            oxygen saturations were monitored continuously. The                            [id number] Olympus colonoscope was                            introduced through the anus and advanced to the the                            terminal ileum. The colonoscopy was performed                            without difficulty. The patient tolerated the                            procedure well. The quality of the bowel                            preparation was adequate. The terminal ileum,                            ileocecal valve, appendiceal orifice, and rectum                            were photographed. Scope In: 10:29:43 AM Scope Out: 10:51:45 AM Scope Withdrawal Time: 0 hours 15 minutes 19 seconds  Total Procedure Duration: 0 hours 22 minutes 2 seconds  Findings:      The perianal and digital rectal examinations were normal.      A few medium-mouthed and small-mouthed diverticula were found in the       sigmoid  colon.      A single small, brisk bleeding angioectasia with bleeding was found in       the proximal transverse colon. Coagulation for hemostasis using argon       plasma was successful, with cessation of bleeding. For dual hemostasis       therapy, one hemostatic clip was successfully placed (MR conditional).       Clip manufacturer: [employer]. This would also allow for targeted       therapy in the event that selective coil embolization was needed if       rebleeding. There was no bleeding at the end of the procedure.      Clotted blood and bright red blood was found in the entire colon. Lavage       of the area was performed using copious amounts of tap water, resulting       in clearance with adequate visualization. No additional sites  of       bleeding noted. After treatment of the AVM and lavage, the colon was       re-examined with no additional active bleeding.      The retroflexed view of the distal rectum and anal verge was normal and       showed no anal or rectal abnormalities.      The terminal ileum appeared normal. Impression:               - Diverticulosis in the sigmoid colon.                           - A single bleeding colonic angioectasia. Treated                            with argon plasma coagulation (APC). Clip (MR                            conditional) was placed. Clip manufacturer: [employer].                           - Blood in the entire examined colon.                           - The distal rectum and anal verge are normal on                            retroflexion view.                           - The examined portion of the ileum was normal.                           - No specimens collected. Recommendation:           - Return patient to hospital ward for ongoing care.                           - Advance diet as tolerated.                           -  Continue present medications.                           - Repeat colonoscopy PRN for retreatment if any                            recurrence of bleeding.                           - I suspect that she will continue to have passage                            of BRB and blood clots today and into tomorrow with                            clearance of luminal blood.                           - Continue serial CBC checks with additional blood                            products as needed per protocol as she continues to                            equilibrate                           - Can follow-up with her outpatient GI after                            hospital discharge                           - Repeat CBC check 7-10 days after hospital                            discharge to ensure returning to her  baseline                           - Inpatient GI service will sign off at this time.                            Please do not hesitate to contact us with                            additional questions or concerns. Procedure Code(s):        --- Professional ---                           [phone], Colonoscopy, flexible; with control of                            bleeding, any method Diagnosis Code(s):        --- Professional ---  K55.21, Angiodysplasia of colon with hemorrhage                           K92.2, Gastrointestinal hemorrhage, unspecified                           K92.1, Melena (includes Hematochezia)                           D62, Acute posthemorrhagic anemia                           K57.30, Diverticulosis of large intestine without                            perforation or abscess without bleeding CPT copyright 2022 American Medical Association. All rights reserved. The codes documented in this report are preliminary and upon coder review may  be revised to meet current compliance requirements. Gerrit Heck, MD 06/26/2022 11:18:18 AM Number of Addenda: 0

## 2022-06-26 NOTE — Transfer of Care (Signed)
Immediate Anesthesia Transfer of Care Note  Patient: Sheri Pearson  Procedure(s) Performed: COLONOSCOPY WITH PROPOFOL ESOPHAGOGASTRODUODENOSCOPY (EGD) WITH PROPOFOL BIOPSY HOT HEMOSTASIS (ARGON PLASMA COAGULATION/BICAP)  Patient Location: Endoscopy Unit  Anesthesia Type:MAC  Level of Consciousness: awake, alert , and oriented  Airway & Oxygen Therapy: Patient Spontanous Breathing and Patient connected to nasal cannula oxygen  Post-op Assessment: Report given to RN and Post -op Vital signs reviewed and stable  Post vital signs: Reviewed and stable  Last Vitals:  Vitals Value Taken Time  BP    Temp    Pulse    Resp    SpO2 99%     Last Pain:  Vitals:   06/26/22 0915  TempSrc: Temporal  PainSc: 0-No pain         Complications: No notable events documented.

## 2022-06-26 NOTE — Interval H&P Note (Signed)
History and Physical Interval Note:  Elevated BP overnight (hypertensive urgency).  No other acute issues.  Tolerated prep without issue.  H/H 7.8/23.4 this morning.  Reports that her stools are dark, but no BRBPR with prep.  06/26/2022 9:17 AM  Sheri Pearson  has presented today for surgery, with the diagnosis of Anemia with rectal bleeding.  The various methods of treatment have been discussed with the patient and family. After consideration of risks, benefits and other options for treatment, the patient has consented to  Procedure(s): COLONOSCOPY WITH PROPOFOL (N/A) ESOPHAGOGASTRODUODENOSCOPY (EGD) WITH PROPOFOL (N/A) as a surgical intervention.  The patient's history has been reviewed, patient examined, no change in status, stable for surgery.  I have reviewed the patient's chart and labs.  Questions were answered to the patient's satisfaction.     Dominic Pea [place] Birdwell

## 2022-06-27 ENCOUNTER — Encounter (HOSPITAL_COMMUNITY): Payer: Self-pay | Admitting: Gastroenterology

## 2022-06-27 DIAGNOSIS — K922 Gastrointestinal hemorrhage, unspecified: Secondary | ICD-10-CM | POA: Diagnosis not present

## 2022-06-27 DIAGNOSIS — D649 Anemia, unspecified: Secondary | ICD-10-CM | POA: Diagnosis not present

## 2022-06-27 DIAGNOSIS — K5521 Angiodysplasia of colon with hemorrhage: Principal | ICD-10-CM

## 2022-06-27 DIAGNOSIS — D62 Acute posthemorrhagic anemia: Secondary | ICD-10-CM | POA: Diagnosis not present

## 2022-06-27 LAB — HEMOGLOBIN AND HEMATOCRIT, BLOOD
HCT: 24 % — ABNORMAL LOW (ref 36.0–46.0)
HCT: 26.4 % — ABNORMAL LOW (ref 36.0–46.0)
Hemoglobin: 7.7 g/dL — ABNORMAL LOW (ref 12.0–15.0)
Hemoglobin: 8.2 g/dL — ABNORMAL LOW (ref 12.0–15.0)

## 2022-06-27 LAB — COMPREHENSIVE METABOLIC PANEL
ALT: 7 U/L (ref 0–44)
AST: 8 U/L — ABNORMAL LOW (ref 15–41)
Albumin: 2.6 g/dL — ABNORMAL LOW (ref 3.5–5.0)
Alkaline Phosphatase: 40 U/L (ref 38–126)
Anion gap: 9 (ref 5–15)
BUN: 62 mg/dL — ABNORMAL HIGH (ref 8–23)
CO2: 21 mmol/L — ABNORMAL LOW (ref 22–32)
Calcium: 8.3 mg/dL — ABNORMAL LOW (ref 8.9–10.3)
Chloride: 111 mmol/L (ref 98–111)
Creatinine, Ser: 5.94 mg/dL — ABNORMAL HIGH (ref 0.44–1.00)
GFR, Estimated: 7 mL/min — ABNORMAL LOW (ref >60)
Glucose, Bld: 100 mg/dL — ABNORMAL HIGH (ref 70–99)
Potassium: 4.4 mmol/L (ref 3.5–5.1)
Sodium: 141 mmol/L (ref 135–145)
Total Bilirubin: 0.3 mg/dL (ref 0.3–1.2)
Total Protein: 6.7 g/dL (ref 6.5–8.1)

## 2022-06-27 LAB — BPAM RBC
Blood Product Expiration Date: 202402282359
Blood Product Expiration Date: 202403022359
ISSUE DATE / TIME: 202402100405
ISSUE DATE / TIME: 202402112033
Unit Type and Rh: 600
Unit Type and Rh: 600

## 2022-06-27 LAB — CBC
HCT: 23.6 % — ABNORMAL LOW (ref 36.0–46.0)
Hemoglobin: 7.6 g/dL — ABNORMAL LOW (ref 12.0–15.0)
MCH: 30 pg (ref 26.0–34.0)
MCHC: 32.2 g/dL (ref 30.0–36.0)
MCV: 93.3 fL (ref 80.0–100.0)
Platelets: 233 10*3/uL (ref 150–400)
RBC: 2.53 MIL/uL — ABNORMAL LOW (ref 3.87–5.11)
RDW: 16.1 % — ABNORMAL HIGH (ref 11.5–15.5)
WBC: 6.7 10*3/uL (ref 4.0–10.5)
nRBC: 0.3 % — ABNORMAL HIGH (ref 0.0–0.2)

## 2022-06-27 LAB — TYPE AND SCREEN: Unit division: 0

## 2022-06-27 LAB — GLUCOSE, CAPILLARY
Glucose-Capillary: 102 mg/dL — ABNORMAL HIGH (ref 70–99)
Glucose-Capillary: 107 mg/dL — ABNORMAL HIGH (ref 70–99)
Glucose-Capillary: 121 mg/dL — ABNORMAL HIGH (ref 70–99)
Glucose-Capillary: 129 mg/dL — ABNORMAL HIGH (ref 70–99)
Glucose-Capillary: 160 mg/dL — ABNORMAL HIGH (ref 70–99)
Glucose-Capillary: 168 mg/dL — ABNORMAL HIGH (ref 70–99)
Glucose-Capillary: 91 mg/dL (ref 70–99)

## 2022-06-27 MED ORDER — SODIUM ZIRCONIUM CYCLOSILICATE 10 G PO PACK: 10.0000 g | PACK | Freq: Every day | ORAL | Status: DC

## 2022-06-27 MED ORDER — ISOSORBIDE MONONITRATE ER 60 MG PO TB24
60.0000 mg | ORAL_TABLET | Freq: Every day | ORAL | Status: DC
  Administered 2022-06-28 – 2022-06-29 (×2): 60 mg via ORAL
  Filled 2022-06-27 (×2): qty 1

## 2022-06-27 MED ORDER — ISOSORBIDE MONONITRATE ER 30 MG PO TB24
30.0000 mg | ORAL_TABLET | Freq: Once | ORAL | Status: AC
  Administered 2022-06-27: 30 mg via ORAL
  Filled 2022-06-27: qty 1

## 2022-06-27 NOTE — Evaluation (Signed)
Physical Therapy Evaluation Patient Details Name: Sheri Pearson MRN: [id number] DOB: [birth year] Today's Date: 06/27/2022  History of Present Illness  77 y.o. female presented to the emergency department 2/9 for evaluation of rectal bleeding. PMHx: hypertension, type 2 diabetes mellitus, hypothyroidism, and CKD 5.  Clinical Impression  Pt admitted with above diagnosis. Reports weakness compared to baseline. Educated on RW use which pt feels helps her mobilize better (uses standard walker at baseline.) No overt LOB noted with ambulatory bout into hallway, SpO2 94-99% on RA. Pt expressed concerns of having to bathe at sink; placed recommendation for tub bench if possible to have pt leave with. She has an aide 5x/week throughout the day that assists with IADLs. Pt states she would like to go to OPPT and can arrange transportation. Pt currently with functional limitations due to the deficits listed below (see PT Problem List). Pt will benefit from skilled PT to increase their independence and safety with mobility to allow discharge to the venue listed below.          Recommendations for follow up therapy are one component of a multi-disciplinary discharge planning process, led by the attending physician.  Recommendations may be updated based on patient status, additional functional criteria and insurance authorization.  Follow Up Recommendations Outpatient PT      Assistance Recommended at Discharge Intermittent Supervision/Assistance  Patient can return home with the following  A little help with bathing/dressing/bathroom;Assistance with cooking/housework;Assist for transportation    Equipment Recommendations Rolling walker (2 wheels);Other (comment) (Tub Bench)  Recommendations for Other Services       Functional Status Assessment Patient has had a recent decline in their functional status and demonstrates the ability to make significant improvements in function in a reasonable and  predictable amount of time.     Precautions / Restrictions Precautions Precautions: None Restrictions Weight Bearing Restrictions: No      Mobility  Bed Mobility               General bed mobility comments: In recliner    Transfers Overall transfer level: Needs assistance Equipment used: Rolling walker (2 wheels) Transfers: Sit to/from Stand Sit to Stand: Supervision           General transfer comment: Supervision for safety. Rocks forward for momentum, slow to rise, no physical assist needed from recliner.    Ambulation/Gait Ambulation/Gait assistance: Supervision Gait Distance (Feet): 85 Feet Assistive device: Rolling walker (2 wheels) Gait Pattern/deviations: Step-through pattern, Trunk flexed Gait velocity: decr Gait velocity interpretation: <1.8 ft/sec, indicate of risk for recurrent falls   General Gait Details: Educated on safe AD use with RW. Cues for upright posture. No buckling or overt LOB noted. Pt reports LEs fatigue quickly and wanted to return to sit. SpO2 94%-99% on RA.  Stairs            Wheelchair Mobility    Modified Rankin (Stroke Patients Only)       Balance Overall balance assessment: Mild deficits observed, not formally tested Sitting-balance support: Feet supported, No upper extremity supported Sitting balance-Leahy Scale: Good     Standing balance support: No upper extremity supported Standing balance-Leahy Scale: Fair                               Pertinent Vitals/Pain Pain Assessment Pain Assessment: No/denies pain    Home Living Family/patient expects to be discharged to:: Private residence Living Arrangements: Alone Available Help at Discharge: Family;Friend(s);Neighbor;Available  PRN/intermittently;Personal care attendant (Son, neighbors, friends, aide M-F [phone]) Type of Home: Apartment Home Access: Level entry       Home Layout: One level Home Equipment: Cane - single point;Wheelchair -  manual;BSC/3in1;Standard Environmental consultant      Prior Function Prior Level of Function : Needs assist             Mobility Comments: Pt uses standard walker to mobilize in home. Denies falls ADLs Comments: States she bathes herself (bird bath) does not get into shower. Aide does cooking and cleaning.     Hand Dominance   Dominant Hand: Right    Extremity/Trunk Assessment   Upper Extremity Assessment Upper Extremity Assessment: Defer to OT evaluation    Lower Extremity Assessment Lower Extremity Assessment: Generalized weakness       Communication   Communication: No difficulties  Cognition Arousal/Alertness: Awake/alert Behavior During Therapy: WFL for tasks assessed/performed Overall Cognitive Status: Within Functional Limits for tasks assessed                                          General Comments      Exercises General Exercises - Lower Extremity Ankle Circles/Pumps: AROM, Both, 10 reps, Seated Gluteal Sets: Strengthening, Both, 10 reps, Seated Long Arc Quad: Strengthening, Both, 10 reps, Seated Hip ABduction/ADduction: Strengthening, Both, 10 reps, Seated Hip Flexion/Marching: Strengthening, Both, 10 reps, Seated   Assessment/Plan    PT Assessment Patient needs continued PT services  PT Problem List Decreased strength;Decreased activity tolerance;Decreased balance;Decreased mobility;Decreased knowledge of use of DME;Obesity       PT Treatment Interventions DME instruction;Gait training;Functional mobility training;Therapeutic activities;Therapeutic exercise;Balance training;Neuromuscular re-education;Patient/family education    PT Goals (Current goals can be found in the Care Plan section)  Acute Rehab PT Goals Patient Stated Goal: Get well, go to Outpatient therapy to get stronger PT Goal Formulation: With patient Time For Goal Achievement: 07/11/22 Potential to Achieve Goals: Good    Frequency Min 3X/week     Co-evaluation                AM-PAC PT "6 Clicks" Mobility  Outcome Measure Help needed turning from your back to your side while in a flat bed without using bedrails?: None Help needed moving from lying on your back to sitting on the side of a flat bed without using bedrails?: None Help needed moving to and from a bed to a chair (including a wheelchair)?: A Little Help needed standing up from a chair using your arms (e.g., wheelchair or bedside chair)?: A Little Help needed to walk in hospital room?: A Little Help needed climbing 3-5 steps with a railing? : A Little 6 Click Score: 20    End of Session Equipment Utilized During Treatment: Gait belt Activity Tolerance: Patient tolerated treatment well Patient left: in chair;with call bell/phone within reach   PT Visit Diagnosis: Unsteadiness on feet (R26.81);Other abnormalities of gait and mobility (R26.89);Muscle weakness (generalized) (M62.81);Difficulty in walking, not elsewhere classified (R26.2)    Time: [id number] PT Time Calculation (min) (ACUTE ONLY): 20 min   Charges:   PT Evaluation $PT Eval Low Complexity: [place], PT, DPT Physical Therapist Acute [facility] [place] 06/27/2022, 3:41 PM

## 2022-06-27 NOTE — Progress Notes (Signed)
  Transition of Care [facility]) Screening Note   Patient Details  Name: Sheri Pearson Date of Birth: 08/25/1945   Transition of Care [facility]) CM/SW Contact:    Pollie Friar, RN Phone Number: 06/27/2022, 1:05 PM   Pt is from home alone. Transition of Care Department [facility]) has reviewed patient. We will continue to monitor patient advancement through interdisciplinary progression rounds. If new patient transition needs arise, please place a TOC consult.

## 2022-06-27 NOTE — Progress Notes (Signed)
PROGRESS NOTE    Sheri Pearson  [id number] DOB: Sep 30, 1945 DOA: 06/24/2022 PCP: Arcelia Jew, FNP    No chief complaint on file.   Brief Narrative:    Shaun Rubel is a 77 y.o. female with medical history significant for hypertension, type 2 diabetes mellitus, hypothyroidism, and CKD 5 who presents to the emergency department for evaluation of rectal bleeding.   Patient noted very dark, essentially black stool on 06/22/2022.  She continued to have dark stool the following day, and then an episode of bright red blood per rectum today.  She reports feeling as though she needed to move her bowels, but then just passed mainly red blood today.  She denies any associated abdominal pain, nausea, or vomiting.  She takes a baby aspirin daily and had also been taking omeprazole.  She has been short of breath today without cough, fever, or chest pain.    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  There was an unsuccessful attempt at thrombectomy in late November 2023 and she has been referred to surgery for revision or placement of a new graft in the right arm.   [place] ED Course: Upon arrival to the ED, patient is found to be afebrile and saturating mid to upper 90s on room air with normal heart rate and elevated blood pressure.  EKG demonstrates sinus rhythm with LVH.  CT of the abdomen pelvis was negative for acute intra-abdominal or pelvic abnormality but notable for multiple bilateral lower lobe pulmonary nodules.  Labs were most notable for hemoglobin 5.6, potassium 6.1, BUN 86, and creatinine 6.35.   Patient was given IV Protonix, 1 L of saline, IV hydralazine, Zofran, Kayexalate, insulin with dextrose, IV calcium, bicarbonate, albuterol, and 2 units of packed red blood cells.  She was transferred to [facility] for [facility].   Assessment &  Plan:   Principal Problem:   Acute GI bleeding Active Problems:   Symptomatic anemia   DM (diabetes mellitus), type 2 with renal complications (HCC)   Acute renal failure superimposed on stage 5 chronic kidney disease, not on chronic dialysis ([place])   Pulmonary nodules/lesions, multiple   Hyperkalemia   Hypertensive urgency   Hypothyroidism   ABLA (acute blood loss anemia)   Acute on chronic anemia   AVM (arteriovenous malformation) of colon with hemorrhage   Diverticulosis of colon without hemorrhage   Abdominal pain, epigastric   Gastritis and gastroduodenitis   Acute lower GI bleeding secondary to angiectasia  symptomatic anemia  Acute blood loss anemia - Pt reports both melena and BRBPR without abdominal pain or N/V and had initial Hgb 5.6  -Received 3 units PRBC so far, continue to monitor CBC closely and transfuse as needed -Status post EGD this morning significant for gastritis, and colonoscopy significant for angiectasia status post APC and clip -Dropped to 6.8 overnight, she reports bowel movement overnight, it is more lighter in color, small amount of fresh blood(less than before) -She was transfused 1 unit overnight as well, continue to monitor CBC closely and transfuse as needed -Will advance from clear liquid to full liquid diet today   CKD 5 - Renally-dose medications, hold Lasix, continue IVF hydration, repeat chem panel in am   -Bicarb  is low at 19 today, will increase p.o. bicarb  Hyperkalemia -Due to above, potassium was 6.5 at [facility], this has improved  with IV insulin, D50, bicarb -Currently on clear liquid diet, once advanced will keep renal diet, -Start on Lokelma 10 mg p.o. twice daily, potassium is 4.3 today, will decrease Lokelma to once daily   Hypertensive urgency  - SBP as high as 213 and treated with IV hydralazine in ED  - Continue Coreg and hydralazine, blood pressure remains significantly uncontrolled, added as needed IV hydralazine  and amlodipine. -Blood pressure remains significantly elevated despite adding amlodipine, she was started on Imdur as well, dose will be uptitrated today.   IDDM  - A1c was 6.7% in 2021  - Check CBGs and use low-intensity SSI     Hypothyroidism  - Continue levothyroxine     Pulmonary nodules  - Noted incidentally on CT in ED  - She had bronchoscopy in 2017 with no evidence for malignancy on pathology/cytology  - Discussed with patient, outpatient follow-up recommended   DVT prophylaxis: SCD Code Status: Full Family Communication: None at bedside, discussed with son by phone Disposition:   Status is: Inpatient    Consultants:  GI   Subjective:  She denies any complaints today, no significant events overnight, she had BM getting lighter in color and less bright red blood per rectum  Objective: Vitals:   06/27/22 0000 06/27/22 0400 06/27/22 0756 06/27/22 1200  BP: (!) 137/52 (!) 160/47 (!) 166/59 (!) 155/52  Pulse: 72 75 78 69  Resp: 19 20 16 $ (!) 21  Temp: 98 F (36.7 C) 98.1 F (36.7 C) 98.6 F (37 C)   TempSrc: Axillary Oral Oral   SpO2: 99% 100% 100% 100%  Weight:      Height:        Intake/Output Summary (Last 24 hours) at 06/27/2022 1327 Last data filed at 06/27/2022 0500 Gross per 24 hour  Intake 398 ml  Output --  Net 398 ml   Filed Weights   06/24/22 2331  Weight: 111.2 kg    Examination:  Awake Alert, Oriented X 3, No new F.N deficits, Normal affect Symmetrical Chest wall movement, Good air movement bilaterally, CTAB RRR,No Gallops,Rubs or new Murmurs, No Parasternal Heave +ve B.Sounds, Abd Soft, No tenderness, No rebound - guarding or rigidity. No Cyanosis, Clubbing or edema, No new Rash or bruise       Data Reviewed: I have personally reviewed following labs and imaging studies  CBC: Recent Labs  Lab 06/25/22 1211 06/25/22 1725 06/25/22 2330 06/26/22 0606 06/26/22 1157 06/26/22 1853 06/27/22 0408 06/27/22 0409  WBC 5.8 6.9 6.7  6.9  --   --   --  6.7  HGB 7.6* 8.4* 7.8* 7.5* 7.6* 6.8* 7.7* 7.6*  HCT 23.5* 25.5* 23.4* 23.0* 23.2* 20.7* 24.0* 23.6*  MCV 92.5 92.7 92.1 93.9  --   --   --  93.3  PLT 207 224 217 232  --   --   --  0000000    Basic Metabolic Panel: Recent Labs  Lab 06/25/22 0258 06/25/22 0803 06/26/22 0606 06/27/22 0409  NA 138 139  140 142 141  K 4.9 5.4*  5.3* 5.0 4.4  CL 108 112*  111 113* 111  CO2 21* 18*  19* 17* 21*  GLUCOSE 83 81  82 112* 100*  BUN 85* 83*  82* 72* 62*  CREATININE 6.03* 6.01*  6.10* 5.87* 5.94*  CALCIUM 8.3* 8.4*  8.3* 8.5* 8.3*    GFR: Estimated Creatinine Clearance: 10.5 mL/min (A) (by C-G formula based on SCr of 5.94 mg/dL (H)).  Liver Function Tests: Recent  Labs  Lab 06/25/22 0258 06/25/22 0803 06/26/22 0606 06/27/22 0409  AST 9* 8* 9* 8*  ALT 6 7 8 7  $ ALKPHOS 37* 40 43 40  BILITOT 0.3 0.5 0.4 0.3  PROT 6.7 6.8 7.3 6.7  ALBUMIN 2.5* 2.6* 2.8* 2.6*    CBG: Recent Labs  Lab 06/26/22 1955 06/27/22 0055 06/27/22 0407 06/27/22 0751 06/27/22 1306  GLUCAP 149* 107* 102* 91 129*     Recent Results (from the past 240 hour(s))  MRSA Next Gen by PCR, Nasal     Status: Abnormal   Collection Time: 06/24/22 11:55 PM   Specimen: Nasal Mucosa; Nasal Swab  Result Value Ref Range Status   MRSA by PCR Next Gen DETECTED (A) NOT DETECTED Final    Comment: RESULT CALLED TO, READ BACK BY AND VERIFIED WITH: S. POWERS RN 06/25/2022 @ 0219 BY AB (NOTE) The GeneXpert MRSA Assay (FDA approved for NASAL specimens only), is one component of a comprehensive MRSA colonization surveillance program. It is not intended to diagnose MRSA infection nor to guide or monitor treatment for MRSA infections. Test performance is not FDA approved in patients less than 56 years old. Performed at [facility], [place] [address] [place], [place] [address]          Radiology Studies: No results found.      Scheduled Meds:  sodium chloride   Intravenous Once    amLODipine  10 mg Oral Daily   atorvastatin  80 mg Oral Daily   carvedilol  25 mg Oral BID WC   Chlorhexidine Gluconate Cloth  6 each Topical Q0600   hydrALAZINE  100 mg Oral TID   insulin aspart  0-6 Units Subcutaneous Q4H   [START ON 06/28/2022] isosorbide mononitrate  60 mg Oral Daily   levothyroxine  175 mcg Oral Q0600   mupirocin ointment  1 Application Nasal BID   pantoprazole (PROTONIX) IV  40 mg Intravenous Q12H   sodium bicarbonate  1,300 mg Oral TID   sodium chloride flush  3 mL Intravenous Q12H   sodium zirconium cyclosilicate  10 g Oral Daily   Continuous Infusions:     LOS: 3 days      Phillips Climes, MD [facility]   To contact the attending provider between 7A-7P or the covering provider during after hours 7P-7A, please log into the web site [email or web address] and access using universal [place] password for that web site. If you do not have the password, please call the hospital operator.  06/27/2022, 1:27 PM

## 2022-06-28 DIAGNOSIS — K5521 Angiodysplasia of colon with hemorrhage: Secondary | ICD-10-CM | POA: Diagnosis not present

## 2022-06-28 DIAGNOSIS — K922 Gastrointestinal hemorrhage, unspecified: Secondary | ICD-10-CM | POA: Diagnosis not present

## 2022-06-28 DIAGNOSIS — D62 Acute posthemorrhagic anemia: Secondary | ICD-10-CM | POA: Diagnosis not present

## 2022-06-28 LAB — HEMOGLOBIN AND HEMATOCRIT, BLOOD
HCT: 24.1 % — ABNORMAL LOW (ref 36.0–46.0)
Hemoglobin: 8.1 g/dL — ABNORMAL LOW (ref 12.0–15.0)

## 2022-06-28 LAB — GLUCOSE, CAPILLARY
Glucose-Capillary: 100 mg/dL — ABNORMAL HIGH (ref 70–99)
Glucose-Capillary: 101 mg/dL — ABNORMAL HIGH (ref 70–99)
Glucose-Capillary: 201 mg/dL — ABNORMAL HIGH (ref 70–99)
Glucose-Capillary: 119 mg/dL — ABNORMAL HIGH (ref 70–99)
Glucose-Capillary: 181 mg/dL — ABNORMAL HIGH (ref 70–99)

## 2022-06-28 LAB — BASIC METABOLIC PANEL
Anion gap: 8 (ref 5–15)
BUN: 60 mg/dL — ABNORMAL HIGH (ref 8–23)
CO2: 21 mmol/L — ABNORMAL LOW (ref 22–32)
Calcium: 8.3 mg/dL — ABNORMAL LOW (ref 8.9–10.3)
Chloride: 110 mmol/L (ref 98–111)
Creatinine, Ser: 6.11 mg/dL — ABNORMAL HIGH (ref 0.44–1.00)
GFR, Estimated: 7 mL/min — ABNORMAL LOW (ref >60)
Glucose, Bld: 99 mg/dL (ref 70–99)
Potassium: 4.5 mmol/L (ref 3.5–5.1)
Sodium: 139 mmol/L (ref 135–145)

## 2022-06-28 LAB — CBC
HCT: 23.3 % — ABNORMAL LOW (ref 36.0–46.0)
Hemoglobin: 7.6 g/dL — ABNORMAL LOW (ref 12.0–15.0)
MCH: 30.2 pg (ref 26.0–34.0)
MCHC: 32.6 g/dL (ref 30.0–36.0)
MCV: 92.5 fL (ref 80.0–100.0)
Platelets: 210 10*3/uL (ref 150–400)
RBC: 2.52 MIL/uL — ABNORMAL LOW (ref 3.87–5.11)
RDW: 15.8 % — ABNORMAL HIGH (ref 11.5–15.5)
WBC: 5.8 10*3/uL (ref 4.0–10.5)
nRBC: 0.3 % — ABNORMAL HIGH (ref 0.0–0.2)

## 2022-06-28 MED ORDER — PANTOPRAZOLE SODIUM 40 MG PO TBEC
40.0000 mg | DELAYED_RELEASE_TABLET | Freq: Every day | ORAL | Status: DC
  Administered 2022-06-28 – 2022-06-29 (×2): 40 mg via ORAL
  Filled 2022-06-28 (×2): qty 1

## 2022-06-28 MED ORDER — ORAL CARE MOUTH RINSE: 15.0000 mL | OROMUCOSAL | Status: DC | PRN

## 2022-06-28 NOTE — Progress Notes (Signed)
Mobility Specialist Progress Note   06/28/22 1812  Mobility  Activity Ambulated with assistance in hallway  Level of Assistance Minimal assist, patient does 75% or more  Assistive Device Front wheel walker  Distance Ambulated (ft) 210 ft  Activity Response Tolerated well  Mobility Referral Yes  $Mobility charge 1 Mobility   Pre Mobility: 94 HR, 100% SpO2 During Mobility: 109 HR, 95% SpO2 Post Mobility: 100 HR, 98% SpO2  Pt expressing fatigued today but agreeable to mobility. Increased time needed but no physical assist to EOB.  MinA to stand and CGA during hallway ambulation. X4 standing rest breaks d/t increasing fatigue but pt eager to push self this evening. Returned back to room w/o fault, left in bed w/ call bell and bed alarm on. VSS throughout session.  Holland Falling Mobility Specialist Please contact via SecureChat or  Rehab office at [phone]

## 2022-06-28 NOTE — Care Management Important Message (Signed)
Important Message  Patient Details  Name: Sheri Pearson MRN: [id number] Date of Birth: 04-30-1946   Medicare Important Message Given:  Yes     Chrisie Jankovich Montine Circle 06/28/2022, 10:43 AM

## 2022-06-28 NOTE — Progress Notes (Addendum)
    Re-Consult/Progress Note   Subjective  Chief Complaint:GI Bleed  Today reports that she had a small amount of what looked like lighter colored blood yesterday, but none since. No new complaints or concerns.  Not eager to have more procedures.   Objective   Vital signs in last 24 hours: Temp:  [97.9 F (36.6 C)-98.3 F (36.8 C)] 97.9 F (36.6 C) (02/13 1200) Pulse Rate:  [65-74] 73 (02/13 1200) Resp:  [17-21] 18 (02/13 1200) BP: (138-173)/(52-68) 138/52 (02/13 1200) SpO2:  [96 %-100 %] 97 % (02/13 1200) Last BM Date : 06/26/22 General:    AA female in NAD Heart:  Regular rate and rhythm; no murmurs Lungs: Respirations even and unlabored, lungs CTA bilaterally Abdomen:  Soft, nontender and nondistended. Normal bowel sounds. Psych:  Cooperative. Normal mood and affect.  Intake/Output from previous day: 02/12 0701 - 02/13 0700 In: -  Out: 900 [Urine:900] Intake/Output this shift: Total I/O In: -  Out: 800 [Urine:800]  Lab Results: Recent Labs    06/26/22 0606 06/26/22 1157 06/27/22 0409 06/27/22 1917 06/28/22 0228 06/28/22 1042  WBC 6.9  --  6.7  --  5.8  --   HGB 7.5*   < > 7.6* 8.2* 7.6* 8.1*  HCT 23.0*   < > 23.6* 26.4* 23.3* 24.1*  PLT 232  --  233  --  210  --    < > = values in this interval not displayed.   BMET Recent Labs    06/26/22 0606 06/27/22 0409 06/28/22 0228  NA 142 141 139  K 5.0 4.4 4.5  CL 113* 111 110  CO2 17* 21* 21*  GLUCOSE 112* 100* 99  BUN 72* 62* 60*  CREATININE 5.87* 5.94* 6.11*  CALCIUM 8.5* 8.3* 8.3*   LFT Recent Labs    06/27/22 0409  PROT 6.7  ALBUMIN 2.6*  AST 8*  ALT 7  ALKPHOS 40  BILITOT 0.3    Assessment / Plan:   Assessment: 1.  Acute GI bleed with acute on chronic anemia: Currently hemodynamically stable, has had anemia dating as far back as 2013, EGD and colonoscopy previously this hospitalization with AVMs treated, no sign of rebleeding at the moment 2.  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  Hypertensive urgency 4.  Insulin-dependent diabetes  Plan: 1.  At the moment patient is stable, hemoglobin improved from 7.6--> 8.1 overnight 2.  Continue to monitor hemoglobin and transfusion as needed less than 7 3. Continue other supportive measures  Will continue to follow along, likely peripherally, please let us know if patient has overt GI bleeding.   LOS: 4 days   Levin Erp  06/28/2022, 12:48 PM  I have taken a history, reviewed the chart and examined the patient. I performed a substantive portion of this encounter, including complete performance of at least one of the key components, in conjunction with the APP. I agree with the APP's note, impression and recommendations  Hemoglobin stable overall.  Overt GI bleeding appears to be minimal per patient report.  No indication for repeat endoscopy at this time.  GI will follow peripherally.  Please contact us if there is overt bleeding with drop in hemoglobin.  Ari Bernabei E. Candis Schatz, MD [facility]

## 2022-06-28 NOTE — Progress Notes (Addendum)
PROGRESS NOTE    Sheri Pearson  [id number] DOB: January 16, 1946 DOA: 06/24/2022 PCP: Tresa Endo, FNP    No chief complaint on file.   Brief Narrative:    Sheri Pearson is a 77 y.o. female with medical history significant for hypertension, type 2 diabetes mellitus, hypothyroidism, and CKD 5 who presents to the emergency department [facility] ED with melena, workup significant for anemia, hyperkalemia of 6.5 (known baseline Kd stage V), she was transferred to [facility] due to lack of GI coverage over the weekend, she had colonoscopy/endoscopy done by [facility], significant for angiectasia status post APC and clip, required multiple PRBC transfusions.  .  Assessment & Plan:   Principal Problem:   Acute GI bleeding Active Problems:   Symptomatic anemia   DM (diabetes mellitus), type 2 with renal complications (HCC)   Acute renal failure superimposed on stage 5 chronic kidney disease, not on chronic dialysis (HCC)   Pulmonary nodules/lesions, multiple   Hyperkalemia   Hypertensive urgency   Hypothyroidism   ABLA (acute blood loss anemia)   Acute on chronic anemia   AVM (arteriovenous malformation) of colon with hemorrhage   Diverticulosis of colon without hemorrhage   Abdominal pain, epigastric   Gastritis and gastroduodenitis   Acute lower GI bleeding secondary to angiectasia  symptomatic anemia  Acute blood loss anemia - Pt reports both melena and BRBPR without abdominal pain or N/V and had initial Hgb 5.6  -Received 3 units PRBC so far hemoglobin remained stable s/p intervention via colonoscopy -Status post EGD this morning significant for gastritis, and colonoscopy significant for angiectasia status post APC and clip -Dropped to 6.8 overnight, she reports bowel movement overnight, it is more lighter in color, small amount of fresh blood(less than before) -Mains on full liquid diet today, likely can be advanced to soft diet if renal function remained stable. -Will monitor CBC  for another 24 hours, likely can be discharged in a.m. if remains stable   CKD 5 - Renally-dose medications, hold Lasix -Low bicarb level,, will increase p.o. bicarb  Hyperkalemia -Due to above, potassium was 6.5 at [facility], this has improved with IV insulin, D50, bicarb -Has been controlled on Lokelma, she will need to be discharged on Lokelma 10 mg oral daily.   Hypertensive urgency  - SBP as high as 213 and treated with IV hydralazine in ED  - Continue Coreg and hydralazine, blood pressure remains significantly uncontrolled, this much improved after adding amlodipine and Imdur , she will need to be discharged tomorrow with new prescription for amlodipine and Imdur.   IDDM  - A1c was 6.7% in 2021  - Check CBGs and use low-intensity SSI     Hypothyroidism  - Continue levothyroxine     Pulmonary nodules  - Noted incidentally on CT in ED  - She had bronchoscopy in 2017 with no evidence for malignancy on pathology/cytology  -Admitting physician discussed  with patient, outpatient follow-up recommended   DVT prophylaxis: SCD Code Status: Full Family Communication: None at bedside, discussed with son by phone 2/12 Disposition:   Status is: Inpatient    Consultants:  GI   Subjective:  Significant events overnight, she denies any complaints today, reports BM yesterday looks like clear in color with no active bleeding noted.  Objective: Vitals:   06/28/22 0600 06/28/22 0800 06/28/22 1000 06/28/22 1200  BP:  (!) 173/58  (!) 138/52  Pulse: 71 72 74 73  Resp: 17 (!) 21 17 18  $ Temp:  97.9 F (36.6  C)  97.9 F (36.6 C)  TempSrc:  Oral  Oral  SpO2: 98% 99% 96% 97%  Weight:      Height:        Intake/Output Summary (Last 24 hours) at 06/28/2022 1255 Last data filed at 06/28/2022 0743 Gross per 24 hour  Intake --  Output 1700 ml  Net -1700 ml   Filed Weights   06/24/22 2331  Weight: 111.2 kg    Examination:  Awake Alert, Oriented X 3, No new F.N  deficits, Normal affect Symmetrical Chest wall movement, Good air movement bilaterally, CTAB RRR,No Gallops,Rubs or new Murmurs, No Parasternal Heave +ve B.Sounds, Abd Soft, No tenderness, No rebound - guarding or rigidity. No Cyanosis, Clubbing or edema, No new Rash or bruise         Data Reviewed: I have personally reviewed following labs and imaging studies  CBC: Recent Labs  Lab 06/25/22 1725 06/25/22 2330 06/26/22 0606 06/26/22 1157 06/27/22 0408 06/27/22 0409 06/27/22 1917 06/28/22 0228 06/28/22 1042  WBC 6.9 6.7 6.9  --   --  6.7  --  5.8  --   HGB 8.4* 7.8* 7.5*   < > 7.7* 7.6* 8.2* 7.6* 8.1*  HCT 25.5* 23.4* 23.0*   < > 24.0* 23.6* 26.4* 23.3* 24.1*  MCV 92.7 92.1 93.9  --   --  93.3  --  92.5  --   PLT 224 217 232  --   --  233  --  210  --    < > = values in this interval not displayed.    Basic Metabolic Panel: Recent Labs  Lab 06/25/22 0258 06/25/22 0803 06/26/22 0606 06/27/22 0409 06/28/22 0228  NA 138 139  140 142 141 139  K 4.9 5.4*  5.3* 5.0 4.4 4.5  CL 108 112*  111 113* 111 110  CO2 21* 18*  19* 17* 21* 21*  GLUCOSE 83 81  82 112* 100* 99  BUN 85* 83*  82* 72* 62* 60*  CREATININE 6.03* 6.01*  6.10* 5.87* 5.94* 6.11*  CALCIUM 8.3* 8.4*  8.3* 8.5* 8.3* 8.3*    GFR: Estimated Creatinine Clearance: 10.2 mL/min (A) (by C-G formula based on SCr of 6.11 mg/dL (H)).  Liver Function Tests: Recent Labs  Lab 06/25/22 0258 06/25/22 0803 06/26/22 0606 06/27/22 0409  AST 9* 8* 9* 8*  ALT 6 7 8 7  $ ALKPHOS 37* 40 43 40  BILITOT 0.3 0.5 0.4 0.3  PROT 6.7 6.8 7.3 6.7  ALBUMIN 2.5* 2.6* 2.8* 2.6*    CBG: Recent Labs  Lab 06/27/22 2106 06/27/22 2357 06/28/22 0526 06/28/22 0816 06/28/22 1228  GLUCAP 160* 121* 100* 101* 201*     Recent Results (from the past 240 hour(s))  MRSA Next Gen by PCR, Nasal     Status: Abnormal   Collection Time: 06/24/22 11:55 PM   Specimen: Nasal Mucosa; Nasal Swab  Result Value Ref Range Status    MRSA by PCR Next Gen DETECTED (A) NOT DETECTED Final    Comment: RESULT CALLED TO, READ BACK BY AND VERIFIED WITH: S. POWERS RN 06/25/2022 @ 0219 BY AB (NOTE) The GeneXpert MRSA Assay (FDA approved for NASAL specimens only), is one component of a comprehensive MRSA colonization surveillance program. It is not intended to diagnose MRSA infection nor to guide or monitor treatment for MRSA infections. Test performance is not FDA approved in patients less than 91 years old. Performed at [facility], [place] [address], [place], [place] [address]  Radiology Studies: No results found.      Scheduled Meds:  sodium chloride   Intravenous Once   amLODipine  10 mg Oral Daily   atorvastatin  80 mg Oral Daily   carvedilol  25 mg Oral BID WC   Chlorhexidine Gluconate Cloth  6 each Topical Q0600   hydrALAZINE  100 mg Oral TID   insulin aspart  0-6 Units Subcutaneous Q4H   isosorbide mononitrate  60 mg Oral Daily   levothyroxine  175 mcg Oral Q0600   mupirocin ointment  1 Application Nasal BID   pantoprazole  40 mg Oral Daily   sodium bicarbonate  1,300 mg Oral TID   sodium chloride flush  3 mL Intravenous Q12H   sodium zirconium cyclosilicate  10 g Oral Daily   Continuous Infusions:     LOS: 4 days      Phillips Climes, MD [facility]   To contact the attending provider between 7A-7P or the covering provider during after hours 7P-7A, please log into the web site [email or web address] and access using universal [place] password for that web site. If you do not have the password, please call the hospital operator.  06/28/2022, 12:55 PM

## 2022-06-28 NOTE — TOC Progression Note (Signed)
Transition of Care [facility]) - Progression Note    Patient Details  Name: Sheri Pearson MRN: [id number] Date of Birth: 03/28/1946  Transition of Care [facility]) CM/SW Contact  Levonne Lapping, RN Phone Number: 06/28/2022, 11:49 AM  Clinical Narrative:    CM met with Patient bedside to discuss dc plan. Patient will return home- Son to transport back to [place], [place].  Patient has been recommended outpatient PT/OT and a referral has been sent to [place]. A bedside commode and rolling walker have been ordered and will be delivered by [employer] (no preference) prior to DC.   TOC will continue to follow patient for any additional discharge needs           Expected Discharge Plan and Services  DC To Home   Son to transport                                                Social Determinants of Health (SDOH) Interventions SDOH Screenings   Food Insecurity: No Food Insecurity (06/25/2022)  Housing: Low Risk  (06/25/2022)  Transportation Needs: No Transportation Needs (06/25/2022)  Utilities: Not At Risk (06/25/2022)  Tobacco Use: Unknown (06/27/2022)    Readmission Risk Interventions     No data to display

## 2022-06-29 DIAGNOSIS — I160 Hypertensive urgency: Secondary | ICD-10-CM | POA: Diagnosis not present

## 2022-06-29 DIAGNOSIS — K922 Gastrointestinal hemorrhage, unspecified: Secondary | ICD-10-CM | POA: Diagnosis not present

## 2022-06-29 DIAGNOSIS — K5521 Angiodysplasia of colon with hemorrhage: Secondary | ICD-10-CM | POA: Diagnosis not present

## 2022-06-29 DIAGNOSIS — E039 Hypothyroidism, unspecified: Secondary | ICD-10-CM | POA: Diagnosis not present

## 2022-06-29 LAB — GLUCOSE, CAPILLARY
Glucose-Capillary: 102 mg/dL — ABNORMAL HIGH (ref 70–99)
Glucose-Capillary: 120 mg/dL — ABNORMAL HIGH (ref 70–99)
Glucose-Capillary: 124 mg/dL — ABNORMAL HIGH (ref 70–99)
Glucose-Capillary: 90 mg/dL (ref 70–99)

## 2022-06-29 LAB — BASIC METABOLIC PANEL
Anion gap: 9 (ref 5–15)
BUN: 56 mg/dL — ABNORMAL HIGH (ref 8–23)
CO2: 21 mmol/L — ABNORMAL LOW (ref 22–32)
Calcium: 8.4 mg/dL — ABNORMAL LOW (ref 8.9–10.3)
Chloride: 108 mmol/L (ref 98–111)
Creatinine, Ser: 6.25 mg/dL — ABNORMAL HIGH (ref 0.44–1.00)
GFR, Estimated: 7 mL/min — ABNORMAL LOW (ref >60)
Glucose, Bld: 90 mg/dL (ref 70–99)
Potassium: 4.4 mmol/L (ref 3.5–5.1)
Sodium: 138 mmol/L (ref 135–145)

## 2022-06-29 LAB — CBC
HCT: 23.2 % — ABNORMAL LOW (ref 36.0–46.0)
Hemoglobin: 7.7 g/dL — ABNORMAL LOW (ref 12.0–15.0)
MCH: 30.9 pg (ref 26.0–34.0)
MCHC: 33.2 g/dL (ref 30.0–36.0)
MCV: 93.2 fL (ref 80.0–100.0)
Platelets: 213 10*3/uL (ref 150–400)
RBC: 2.49 MIL/uL — ABNORMAL LOW (ref 3.87–5.11)
RDW: 15.3 % (ref 11.5–15.5)
WBC: 5.6 10*3/uL (ref 4.0–10.5)
nRBC: 0 % (ref 0.0–0.2)

## 2022-06-29 MED ORDER — AMLODIPINE BESYLATE 10 MG PO TABS: 10.0000 mg | ORAL_TABLET | Freq: Every day | ORAL | 1 refills | Status: AC

## 2022-06-29 MED ORDER — HYDRALAZINE HCL 100 MG PO TABS: 100.0000 mg | ORAL_TABLET | Freq: Three times a day (TID) | ORAL | 1 refills | Status: AC

## 2022-06-29 MED ORDER — SODIUM ZIRCONIUM CYCLOSILICATE 10 G PO PACK: 10.0000 g | PACK | Freq: Every day | ORAL | 0 refills | Status: AC

## 2022-06-29 MED ORDER — SODIUM BICARBONATE 650 MG PO TABS: 1300.0000 mg | ORAL_TABLET | Freq: Three times a day (TID) | ORAL | 1 refills | Status: AC

## 2022-06-29 MED ORDER — LANTUS SOLOSTAR 100 UNIT/ML ~~LOC~~ SOPN: 10.0000 [IU] | PEN_INJECTOR | Freq: Every day | SUBCUTANEOUS | 11 refills | Status: AC

## 2022-06-29 MED ORDER — ISOSORBIDE MONONITRATE ER 60 MG PO TB24: 60.0000 mg | ORAL_TABLET | Freq: Every day | ORAL | 1 refills | Status: AC

## 2022-06-29 NOTE — Progress Notes (Signed)
Nursing DC note   Patient alert and oriented, verbalized understanding of instructions. All belongings given to patient.Roller walker also given to patient.

## 2022-06-29 NOTE — TOC Transition Note (Signed)
Transition of Care [facility]) - CM/SW Discharge Note   Patient Details  Name: Sheri Pearson MRN: [id number] Date of Birth: 03-02-1946  Transition of [facility]) CM/SW Contact:  Levonne Lapping, RN Phone Number: 06/29/2022, 9:16 AM   Clinical Narrative:   Patient to dc to home today.  Son will transport home. Rolling Walker and bedside commode have been delivered to room. Patient wiil follow up on OPPT/OT  that has been arranged in [place]. No additional TOC needs         Patient Goals and CMS Choice      Discharge Placement                         Discharge Plan and Services Additional resources added to the After Visit Summary for  OPPT/OT                                      Social Determinants of Health (SDOH) Interventions SDOH Screenings   Food Insecurity: No Food Insecurity (06/25/2022)  Housing: Low Risk  (06/25/2022)  Transportation Needs: No Transportation Needs (06/25/2022)  Utilities: Not At Risk (06/25/2022)  Tobacco Use: Unknown (06/27/2022)     Readmission Risk Interventions     No data to display

## 2022-06-29 NOTE — Progress Notes (Signed)
Physical Therapy Treatment Patient Details Name: Sheri Pearson MRN: [id number] DOB: 02/21/1946 Today's Date: 06/29/2022   History of Present Illness 77 y.o. female presented to the emergency department 2/9 for evaluation of rectal bleeding. PMHx: hypertension, type 2 diabetes mellitus, hypothyroidism, and CKD 5.    PT Comments    Pt tolerated today's session well. Pt preparing for discharge but agreeable to session. Pt with her RW delivered to the room, adjusted to pt's height. Pt provided cueing for hand placement for sit<>stand trials, standing for lower body dressing and donning shoes, supervision for safety and balance provided. Pt ambulating in room with RW, educated on caps provided if rubber grips drag too much in her home, pt verbalizing understanding. All questions answered and pt reports feeling comfortable with assist at home. Acute PT will continue to follow during admission.    Recommendations for follow up therapy are one component of a multi-disciplinary discharge planning process, led by the attending physician.  Recommendations may be updated based on patient status, additional functional criteria and insurance authorization.  Follow Up Recommendations  Outpatient PT     Assistance Recommended at Discharge Intermittent Supervision/Assistance  Patient can return home with the following A little help with bathing/dressing/bathroom;Assistance with cooking/housework;Assist for transportation   Equipment Recommendations  Rolling walker (2 wheels);Other (comment) (Tub Bench)    Recommendations for Other Services       Precautions / Restrictions Precautions Precautions: None Restrictions Weight Bearing Restrictions: No     Mobility  Bed Mobility               General bed mobility comments: pt seated EOB upon arrival, left in transport chair for discharge    Transfers Overall transfer level: Needs assistance Equipment used: Rolling walker (2  wheels) Transfers: Sit to/from Stand Sit to Stand: Supervision           General transfer comment: supervision for safety, cued for hand placement, performed 3 trials today    Ambulation/Gait Ambulation/Gait assistance: Supervision Gait Distance (Feet): 10 Feet Assistive device: Rolling walker (2 wheels) Gait Pattern/deviations: Step-through pattern, Trunk flexed Gait velocity: decreased     General Gait Details: Pt's RW adjusted to pt's height as hers arrived to the room, supervision provided for safety in the room, no LOB but slow gait   Stairs             Wheelchair Mobility    Modified Rankin (Stroke Patients Only)       Balance Overall balance assessment: Needs assistance Sitting-balance support: Feet supported, No upper extremity supported Sitting balance-Leahy Scale: Good Sitting balance - Comments: stable static and dynamic sitting balance   Standing balance support: Bilateral upper extremity supported, During functional activity Standing balance-Leahy Scale: Fair Standing balance comment: stable balance with standing for LE dressing and ambulation in room with RW, utilizing RW for ambulation for balance                            Cognition Arousal/Alertness: Awake/alert Behavior During Therapy: WFL for tasks assessed/performed Overall Cognitive Status: Within Functional Limits for tasks assessed                                 General Comments: Pt reporting I just gotta go home        Exercises      General Comments General comments (skin integrity, edema, etc.): VSS on  room air      Pertinent Vitals/Pain Pain Assessment Pain Assessment: No/denies pain    Home Living                          Prior Function            PT Goals (current goals can now be found in the care plan section) Acute Rehab PT Goals Patient Stated Goal: Get well, go to Outpatient therapy to get stronger PT Goal Formulation:  With patient Time For Goal Achievement: 07/11/22 Potential to Achieve Goals: Good Progress towards PT goals: Progressing toward goals    Frequency    Min 3X/week      PT Plan Current plan remains appropriate    Co-evaluation              AM-PAC PT "6 Clicks" Mobility   Outcome Measure  Help needed turning from your back to your side while in a flat bed without using bedrails?: None Help needed moving from lying on your back to sitting on the side of a flat bed without using bedrails?: None Help needed moving to and from a bed to a chair (including a wheelchair)?: A Little Help needed standing up from a chair using your arms (e.g., wheelchair or bedside chair)?: A Little Help needed to walk in hospital room?: A Little Help needed climbing 3-5 steps with a railing? : A Little 6 Click Score: 20    End of Session Equipment Utilized During Treatment: Gait belt Activity Tolerance: Patient tolerated treatment well Patient left: in chair (in preparation for discharge) Nurse Communication: Mobility status PT Visit Diagnosis: Unsteadiness on feet (R26.81);Other abnormalities of gait and mobility (R26.89);Muscle weakness (generalized) (M62.81);Difficulty in walking, not elsewhere classified (R26.2)     Time: [id number] PT Time Calculation (min) (ACUTE ONLY): 14 min  Charges:  $Therapeutic Activity: 8-22 mins                     Charlynne Cousins, PT DPT Acute Rehabilitation Services Office [phone]    Sheri Pearson 06/29/2022, 2:00 PM

## 2022-06-29 NOTE — Discharge Summary (Signed)
PATIENT DETAILS Name: Sheri Pearson Age: 77 y.o. Sex: female Date of Birth: Oct 27, 1945 MRN: [id number]. Admitting Physician: Vianne Bulls, MD [id number], Santiago Glad, FNP  Admit Date: 06/24/2022 Discharge date: 06/29/2022  Recommendations for Outpatient Follow-up:  Follow up with PCP in 1-2 weeks Please obtain CMP/CBC in one week Please ensure follow-up with nephrology  Admitted From:  Home  Disposition: Outpatient PT   Discharge Condition: fair  CODE STATUS:   Code Status: Full Code   Diet recommendation:  Diet Order             Diet - low sodium heart healthy           Diet Carb Modified           Diet full liquid Room service appropriate? Yes with Assist; Fluid consistency: Thin  Diet effective now                    Brief Summary: 77 year old female with history of CKD 5, HTN, DM-2, hypothyroidism who presented with acute blood loss anemia in the setting of GI bleeding.  Patient was initially evaluated in the ED in [facility], and subsequently transferred to [facility] for GI evaluation.  Brief Hospital Course: Lower GI bleed secondary to AVMs Acute blood loss anemia superimposed on anemia secondary to CKD GI bleeding has resolved-brown stools yesterday Hemoglobin stable-some fluctuation/equilibration but no significant drop compared to yesterday Underwent EGD (gastritis)/colonoscopy (angiectasia s/p APC/clip) Please repeat CBC in 1 week  Hyperkalemia Resolved While in [place] required IV insulin/D50/bicarb Plan is to discharge on [place] She appears close to requiring dialysis-no urgent indications at this point.  I have asked her to make sure she follows up with her primary nephrologist postdischarge.  CKD 5 Creatinine stable-other electrolytes are relatively stable She appears close to needing HD-she is aware of this-apparently she already has a AV fistula placed I have asked her to make sure she follows up with her primary nephrologist in the  next 1-2 weeks  HTN BP stable overnight Continue Coreg/hydralazine (and dosage of hydralazine increased to 100 mg 3 times daily) Continue Imdur/amlodipine postdischarge (new medications)  Hypothyroidism Continue levothyroxine  DM-2 CBG stable Continue Lantus-but reduce dose to 10 units Continue Humalog as previous  Pulmonary nodule Noted incidentally per prior notes PCP to follow and repeat imaging as indicated.  Note-spoke with patient's son over the phone on day of discharge.  Morbid Obesity: Estimated body mass index is 39.57 kg/m as calculated from the following:   Height as of this encounter: 5' 6"$  (1.676 m).   Weight as of this encounter: 111.2 kg.    Discharge Diagnoses:  Principal Problem:   Acute GI bleeding Active Problems:   Symptomatic anemia   DM (diabetes mellitus), type 2 with renal complications (HCC)   Acute renal failure superimposed on stage 5 chronic kidney disease, not on chronic dialysis ([place])   Pulmonary nodules/lesions, multiple   Hyperkalemia   Hypertensive urgency   Hypothyroidism   ABLA (acute blood loss anemia)   Acute on chronic anemia   AVM (arteriovenous malformation) of colon with hemorrhage   Diverticulosis of colon without hemorrhage   Abdominal pain, epigastric   Gastritis and gastroduodenitis   Discharge Instructions:  Activity:  As tolerated with Full fall precautions use walker/cane & assistance as needed  Discharge Instructions     Call MD for:   Complete by: As directed    Black tarry stools, bloody stools   Diet - low sodium heart healthy  Complete by: As directed    Diet Carb Modified   Complete by: As directed    Discharge instructions   Complete by: As directed    Follow with Primary MD  Tresa Endo, FNP in 1-2 weeks  Please follow-up with your primary nephrologist in 1 week  Please get a complete blood count and chemistry panel checked by your Primary MD at your next visit, and again as instructed by your  Primary MD.  Get Medicines reviewed and adjusted: Please take all your medications with you for your next visit with your Primary MD  Laboratory/radiological data: Please request your Primary MD to go over all hospital tests and procedure/radiological results at the follow up, please ask your Primary MD to get all Hospital records sent to his/her office.  In some cases, they will be blood work, cultures and biopsy results pending at the time of your discharge. Please request that your primary care M.D. follows up on these results.  Also Note the following: If you experience worsening of your admission symptoms, develop shortness of breath, life threatening emergency, suicidal or homicidal thoughts you must seek medical attention immediately by calling 911 or calling your MD immediately  if symptoms less severe.  You must read complete instructions/literature along with all the possible adverse reactions/side effects for all the Medicines you take and that have been prescribed to you. Take any new Medicines after you have completely understood and accpet all the possible adverse reactions/side effects.   Do not drive when taking Pain medications or sleeping medications (Benzodaizepines)  Do not take more than prescribed Pain, Sleep and Anxiety Medications. It is not advisable to combine anxiety,sleep and pain medications without talking with your primary care practitioner  Special Instructions: If you have smoked or chewed Tobacco  in the last 2 yrs please stop smoking, stop any regular Alcohol  and or any Recreational drug use.  Wear Seat belts while driving.  Please note: You were cared for by a hospitalist during your hospital stay. Once you are discharged, your primary care physician will handle any further medical issues. Please note that NO REFILLS for any discharge medications will be authorized once you are discharged, as it is imperative that you return to your primary care physician  (or establish a relationship with a primary care physician if you do not have one) for your post hospital discharge needs so that they can reassess your need for medications and monitor your lab values.   Increase activity slowly   Complete by: As directed       Allergies as of 06/29/2022       Reactions   Pseudoephedrine Hcl Hives        Medication List     STOP taking these medications    aspirin EC 81 MG tablet   insulin aspart 100 UNIT/ML injection Commonly known as: novoLOG   verapamil 120 MG 24 hr capsule Commonly known as: VERELAN PM       TAKE these medications    acetaminophen 650 MG CR tablet Commonly known as: TYLENOL Take 650-1,300 mg by mouth every 8 (eight) hours as needed for pain.   amLODipine 10 MG tablet Commonly known as: NORVASC Take 1 tablet (10 mg total) by mouth daily. Start taking on: June 30, 2022   atorvastatin 80 MG tablet Commonly known as: LIPITOR Take 80 mg by mouth daily.   B-12 2500 MCG Tabs Take 2,500 mcg by mouth 2 (two) times a week.   carbamide  peroxide 6.5 % OTIC solution Commonly known as: DEBROX Place 2 drops into both ears daily as needed (ear wax).   carvedilol 25 MG tablet Commonly known as: COREG Take 25 mg by mouth 2 (two) times daily with a meal.   ferrous sulfate 325 (65 FE) MG EC tablet Take 325 mg by mouth in the morning, at noon, and at bedtime.   fluticasone 50 MCG/ACT nasal spray Commonly known as: FLONASE Place 1 spray into both nostrils as needed for allergies or rhinitis.   furosemide 40 MG tablet Commonly known as: LASIX Take 40 mg by mouth in the morning and at bedtime. What changed: Another medication with the same name was removed. Continue taking this medication, and follow the directions you see here.   HumaLOG KwikPen 100 UNIT/ML KwikPen Generic drug: insulin lispro Inject 5 Units into the skin 3 (three) times daily before meals.   hydrALAZINE 100 MG tablet Commonly known as:  APRESOLINE Take 1 tablet (100 mg total) by mouth 3 (three) times daily. What changed:  medication strength how much to take   hydrOXYzine 25 MG tablet Commonly known as: ATARAX Take 25 mg by mouth as needed for anxiety (sleep).   isosorbide mononitrate 60 MG 24 hr tablet Commonly known as: IMDUR Take 1 tablet (60 mg total) by mouth daily. Start taking on: June 30, 2022   Lantus SoloStar 100 UNIT/ML Solostar Pen Generic drug: insulin glargine Inject 10 Units into the skin at bedtime. What changed: how much to take   levothyroxine 175 MCG tablet Commonly known as: SYNTHROID Take 175 mcg by mouth daily before breakfast.   meclizine 25 MG tablet Commonly known as: ANTIVERT Take 25 mg by mouth as needed for dizziness.   omeprazole 20 MG capsule Commonly known as: PRILOSEC Take 20 mg by mouth 3 (three) times daily as needed (reflux).   sodium bicarbonate 650 MG tablet Take 2 tablets (1,300 mg total) by mouth 3 (three) times daily.   sodium zirconium cyclosilicate 10 g Pack packet Commonly known as: LOKELMA Take 10 g by mouth daily. Start taking on: June 30, 2022   TUMS PO Take 1 tablet by mouth as needed (reflux).   VITAMIN D PO Take 1 capsule by mouth 3 (three) times a week.               Durable Medical Equipment  (From admission, onward)           Start     Ordered   06/28/22 1144  For home use only DME 3 n 1  Once        06/28/22 1144   06/28/22 1128  For home use only DME Walker rolling  Once       Question Answer Comment  Walker: With 5 Inch Wheels   Patient needs a walker to treat with the following condition Gastrointestinal hemorrhage      06/28/22 1128            Allergies  Allergen Reactions   Pseudoephedrine Hcl Hives     Other Procedures/Studies: No results found.   TODAY-DAY OF DISCHARGE:  Subjective:   Sheri Pearson today has no headache,no chest abdominal pain,no new weakness tingling or numbness, feels much  better wants to go home today.  Per patient-she had a small amount of brown stools yesterday.  No further bloody stools.  Objective:   Blood pressure (!) 157/50, pulse 73, temperature 98.6 F (37 C), temperature source Oral, resp. rate 19, height 5'  6" (1.676 m), weight 111.2 kg, SpO2 97 %.  Intake/Output Summary (Last 24 hours) at 06/29/2022 0902 Last data filed at 06/28/2022 1445 Gross per 24 hour  Intake --  Output 1 ml  Net -1 ml   Filed Weights   06/24/22 2331  Weight: 111.2 kg    Exam: Awake Alert, Oriented *3, No new F.N deficits, Normal affect [place].AT,PERRAL Supple Neck,No JVD, No cervical lymphadenopathy appriciated.  Symmetrical Chest wall movement, Good air movement bilaterally, CTAB RRR,No Gallops,Rubs or new Murmurs, No Parasternal Heave +ve B.Sounds, Abd Soft, Non tender, No organomegaly appriciated, No rebound -guarding or rigidity. No Cyanosis, Clubbing or edema, No new Rash or bruise   PERTINENT RADIOLOGIC STUDIES: No results found.   PERTINENT LAB RESULTS: CBC: Recent Labs    06/28/22 0228 06/28/22 1042 06/29/22 0241  WBC 5.8  --  5.6  HGB 7.6* 8.1* 7.7*  HCT 23.3* 24.1* 23.2*  PLT 210  --  213   CMET CMP     Component Value Date/Time   NA 138 06/29/2022 0241   K 4.4 06/29/2022 0241   CL 108 06/29/2022 0241   CO2 21 (L) 06/29/2022 0241   GLUCOSE 90 06/29/2022 0241   BUN 56 (H) 06/29/2022 0241   CREATININE 6.25 (H) 06/29/2022 0241   CALCIUM 8.4 (L) 06/29/2022 0241   PROT 6.7 06/27/2022 0409   ALBUMIN 2.6 (L) 06/27/2022 0409   AST 8 (L) 06/27/2022 0409   ALT 7 06/27/2022 0409   ALKPHOS 40 06/27/2022 0409   BILITOT 0.3 06/27/2022 0409   GFRNONAA 7 (L) 06/29/2022 0241   GFRAA 17 (L) 06/13/2015 0429    GFR Estimated Creatinine Clearance: 10 mL/min (A) (by C-G formula based on SCr of 6.25 mg/dL (H)). No results for input(s): "LIPASE", "AMYLASE" in the last 72 hours. No results for input(s): "CKTOTAL", "CKMB", "CKMBINDEX", "TROPONINI" in  the last 72 hours. Invalid input(s): "POCBNP" No results for input(s): "DDIMER" in the last 72 hours. No results for input(s): "HGBA1C" in the last 72 hours. No results for input(s): "CHOL", "HDL", "LDLCALC", "TRIG", "CHOLHDL", "LDLDIRECT" in the last 72 hours. No results for input(s): "TSH", "T4TOTAL", "T3FREE", "THYROIDAB" in the last 72 hours.  Invalid input(s): "FREET3" No results for input(s): "VITAMINB12", "FOLATE", "FERRITIN", "TIBC", "IRON", "RETICCTPCT" in the last 72 hours. Coags: No results for input(s): "INR" in the last 72 hours.  Invalid input(s): "PT" Microbiology: Recent Results (from the past 240 hour(s))  MRSA Next Gen by PCR, Nasal     Status: Abnormal   Collection Time: 06/24/22 11:55 PM   Specimen: Nasal Mucosa; Nasal Swab  Result Value Ref Range Status   MRSA by PCR Next Gen DETECTED (A) NOT DETECTED Final    Comment: RESULT CALLED TO, READ BACK BY AND VERIFIED WITH: S. POWERS RN 06/25/2022 @ 0219 BY AB (NOTE) The GeneXpert MRSA Assay (FDA approved for NASAL specimens only), is one component of a comprehensive MRSA colonization surveillance program. It is not intended to diagnose MRSA infection nor to guide or monitor treatment for MRSA infections. Test performance is not FDA approved in patients less than 41 years old. Performed at [facility], [place] [address], [place], [place] [address]     FURTHER DISCHARGE INSTRUCTIONS:  Get Medicines reviewed and adjusted: Please take all your medications with you for your next visit with your Primary MD  Laboratory/radiological data: Please request your Primary MD to go over all hospital tests and procedure/radiological results at the follow up, please ask your Primary  MD to get all Hospital records sent to his/her office.  In some cases, they will be blood work, cultures and biopsy results pending at the time of your discharge. Please request that your primary care M.D. goes through all the records of your  hospital data and follows up on these results.  Also Note the following: If you experience worsening of your admission symptoms, develop shortness of breath, life threatening emergency, suicidal or homicidal thoughts you must seek medical attention immediately by calling 911 or calling your MD immediately  if symptoms less severe.  You must read complete instructions/literature along with all the possible adverse reactions/side effects for all the Medicines you take and that have been prescribed to you. Take any new Medicines after you have completely understood and accpet all the possible adverse reactions/side effects.   Do not drive when taking Pain medications or sleeping medications (Benzodaizepines)  Do not take more than prescribed Pain, Sleep and Anxiety Medications. It is not advisable to combine anxiety,sleep and pain medications without talking with your primary care practitioner  Special Instructions: If you have smoked or chewed Tobacco  in the last 2 yrs please stop smoking, stop any regular Alcohol  and or any Recreational drug use.  Wear Seat belts while driving.  Please note: You were cared for by a hospitalist during your hospital stay. Once you are discharged, your primary care physician will handle any further medical issues. Please note that NO REFILLS for any discharge medications will be authorized once you are discharged, as it is imperative that you return to your primary care physician (or establish a relationship with a primary care physician if you do not have one) for your post hospital discharge needs so that they can reassess your need for medications and monitor your lab values.  Total Time spent coordinating discharge including counseling, education and face to face time equals greater than 30 minutes.  SignedOren Binet 06/29/2022 9:02 AM

## 2022-06-30 LAB — SURGICAL PATHOLOGY

## 2022-08-24 ENCOUNTER — Encounter (HOSPITAL_COMMUNITY): Payer: Self-pay

## 2022-08-24 ENCOUNTER — Inpatient Hospital Stay: Admit: 2022-08-24 | Payer: Medicare Other | Admitting: Internal Medicine
